# Patient Record
Sex: Female | Born: 1955 | Race: Black or African American | Hispanic: No | Marital: Married | Smoking: Never smoker
Health system: Southern US, Community
[De-identification: ages and names within clinical notes are randomized; demographics above are authoritative.]

## PROBLEM LIST (undated history)

## (undated) DIAGNOSIS — I10 Essential (primary) hypertension: Secondary | ICD-10-CM

## (undated) DIAGNOSIS — E079 Disorder of thyroid, unspecified: Secondary | ICD-10-CM

## (undated) DIAGNOSIS — E039 Hypothyroidism, unspecified: Secondary | ICD-10-CM

## (undated) DIAGNOSIS — N289 Disorder of kidney and ureter, unspecified: Secondary | ICD-10-CM

## (undated) DIAGNOSIS — R011 Cardiac murmur, unspecified: Secondary | ICD-10-CM

## (undated) DIAGNOSIS — I341 Nonrheumatic mitral (valve) prolapse: Secondary | ICD-10-CM

## (undated) HISTORY — PX: ABLATION: SHX5711

## (undated) HISTORY — PX: BUNIONECTOMY: SHX129

## (undated) HISTORY — DX: Disorder of kidney and ureter, unspecified: N28.9

## (undated) HISTORY — DX: Cardiac murmur, unspecified: R01.1

## (undated) HISTORY — PX: MANDIBLE SURGERY: SHX707

## (undated) HISTORY — DX: Disorder of thyroid, unspecified: E07.9

## (undated) HISTORY — DX: Essential (primary) hypertension: I10

## (undated) HISTORY — DX: Nonrheumatic mitral (valve) prolapse: I34.1

---
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
## 2005-09-07 ENCOUNTER — Ambulatory Visit: Payer: Self-pay | Admitting: Cardiology

## 2006-12-11 ENCOUNTER — Encounter: Admission: RE | Admit: 2006-12-11 | Discharge: 2006-12-11 | Payer: Self-pay | Admitting: Obstetrics and Gynecology

## 2006-12-25 ENCOUNTER — Encounter: Admission: RE | Admit: 2006-12-25 | Discharge: 2006-12-25 | Payer: Self-pay | Admitting: Obstetrics and Gynecology

## 2007-04-30 ENCOUNTER — Ambulatory Visit: Payer: Self-pay | Admitting: Gastroenterology

## 2007-06-28 ENCOUNTER — Ambulatory Visit: Payer: Self-pay | Admitting: Gastroenterology

## 2007-07-03 ENCOUNTER — Encounter: Payer: Self-pay | Admitting: Gastroenterology

## 2007-07-03 ENCOUNTER — Ambulatory Visit: Payer: Self-pay | Admitting: Gastroenterology

## 2007-07-03 DIAGNOSIS — E039 Hypothyroidism, unspecified: Secondary | ICD-10-CM

## 2007-07-03 DIAGNOSIS — D509 Iron deficiency anemia, unspecified: Secondary | ICD-10-CM

## 2007-07-03 HISTORY — PX: COLONOSCOPY: SHX174

## 2007-07-05 ENCOUNTER — Encounter: Admission: RE | Admit: 2007-07-05 | Discharge: 2007-07-05 | Payer: Self-pay | Admitting: Obstetrics and Gynecology

## 2007-08-20 ENCOUNTER — Ambulatory Visit: Payer: Self-pay | Admitting: Gastroenterology

## 2007-08-20 LAB — CONVERTED CEMR LAB
OCCULT 2: NEGATIVE
OCCULT 3: NEGATIVE
OCCULT 4: NEGATIVE

## 2007-08-29 ENCOUNTER — Ambulatory Visit (HOSPITAL_COMMUNITY): Admission: RE | Admit: 2007-08-29 | Discharge: 2007-08-29 | Payer: Self-pay | Admitting: Obstetrics and Gynecology

## 2007-08-29 ENCOUNTER — Encounter (INDEPENDENT_AMBULATORY_CARE_PROVIDER_SITE_OTHER): Payer: Self-pay | Admitting: Obstetrics and Gynecology

## 2007-08-29 DIAGNOSIS — I059 Rheumatic mitral valve disease, unspecified: Secondary | ICD-10-CM | POA: Insufficient documentation

## 2007-08-29 DIAGNOSIS — I4949 Other premature depolarization: Secondary | ICD-10-CM

## 2007-08-29 DIAGNOSIS — J029 Acute pharyngitis, unspecified: Secondary | ICD-10-CM | POA: Insufficient documentation

## 2007-11-15 ENCOUNTER — Ambulatory Visit: Payer: Self-pay

## 2007-11-21 ENCOUNTER — Ambulatory Visit: Payer: Self-pay | Admitting: Cardiology

## 2008-03-03 ENCOUNTER — Ambulatory Visit: Payer: Self-pay

## 2008-03-03 ENCOUNTER — Encounter: Payer: Self-pay | Admitting: Cardiology

## 2008-03-12 ENCOUNTER — Encounter: Admission: RE | Admit: 2008-03-12 | Discharge: 2008-03-12 | Payer: Self-pay | Admitting: Obstetrics and Gynecology

## 2008-03-12 ENCOUNTER — Ambulatory Visit: Payer: Self-pay | Admitting: Cardiology

## 2008-03-12 LAB — CONVERTED CEMR LAB
BUN: 13 mg/dL (ref 6–23)
CO2: 27 meq/L (ref 19–32)
Creatinine, Ser: 1 mg/dL (ref 0.4–1.2)

## 2009-01-11 ENCOUNTER — Encounter (INDEPENDENT_AMBULATORY_CARE_PROVIDER_SITE_OTHER): Payer: Self-pay | Admitting: *Deleted

## 2009-12-07 ENCOUNTER — Encounter: Admission: RE | Admit: 2009-12-07 | Discharge: 2009-12-07 | Payer: Self-pay | Admitting: Obstetrics and Gynecology

## 2010-07-17 ENCOUNTER — Encounter: Payer: Self-pay | Admitting: Obstetrics and Gynecology

## 2010-07-18 ENCOUNTER — Encounter: Payer: Self-pay | Admitting: Obstetrics and Gynecology

## 2010-11-08 NOTE — Op Note (Signed)
Courtney Elliott, Courtney Elliott              ACCOUNT NO.:  [id number]   MEDICAL RECORD NO.:  [id number]          PATIENT TYPE:  AMB   LOCATION:  SDC                           FACILITY:  [facility]   PHYSICIAN:  Hal Morales, M.D.DATE OF BIRTH:  03-13-1956   DATE OF PROCEDURE:  08/29/2007  DATE OF DISCHARGE:                               OPERATIVE REPORT   PREOPERATIVE DIAGNOSES:  1. Intermenstrual bleeding.  2. Endometrial polyp.  3. Menorrhagia.   POSTOPERATIVE DIAGNOSES:  1. Intermenstrual bleeding.  2. Endometrial polyp.  3. Menorrhagia.   OPERATION:  Hysteroscopy, polyp resection, uterine curettage,  endometrial ablation with NovaSure.   SURGEON:  Dr. Dierdre Forth.   ANESTHESIA:  General.   ESTIMATED BLOOD LOSS:  Less than 10 mL.   COMPLICATIONS:  None.   FINDINGS:  The uterus sounded to 8 cm.  The cervical length was 2 cm.  The endometrial width was 4.3 cm.  There was a 1 cm polypoid lesion  emanating from the left lateral endometrial cavity.  The remainder of  the endometrium appeared atrophic.   DESCRIPTION OF PROCEDURE:  The patient was taken to the operating room  after appropriate identification and placed on the operating table.  After the attainment of adequate general anesthesia, she was placed in  the lithotomy position. The perineum and vagina were prepped with  multiple layers of Betadine and draped as a sterile field.  A red  Robinson catheter was used to empty the bladder.  A Graves speculum was  placed in the vagina and a paracervical block achieved with a total of  10 mL of 2% Xylocaine in the 5 and 7 o'clock positions.  The cervix was  grasped with a single-tooth tenaculum. The cervix was measured and then  the uterus sounded.  The cervix was dilated to accommodate the small  hysteroscope and that was used to observe and document the above-noted  findings.  A scissor was placed through the operating channel of the  hysteroscope and the base of the polyp,  cut, then a grasping forceps  used to remove the polyps from the endometrial cavity. The remainder of  the endometrial cavity was then curetted.  The cervix was further  dilated to accommodate the NovaSure endometrial ablation apparatus.  It  was placed into the endometrial cavity and the array opened.  It was  then seated and the test for endometrial integrity undertaken.  Initial  failure of that test and bubbling of gas from the posterior cervix  indicated that the cervical collar was not adequately occluding the  cervix.  The cervix was then wrapped with Vaseline gauze and the test  for integrity of the endometrial cavity subsequently passed.  The  endometrial ablation was then begun and continued for a total of 60  seconds.  The apparatus was allowed to cool and the array retracted into  the NovaSure apparatus and then the entire apparatus removed from the  endometrial cavity.  The tenaculum was removed from the cervix and all  instruments removed from the vagina.  The patient was awakened from  general anesthesia and taken  to the recovery room in satisfactory  condition having tolerated the procedure well with sponge and instrument  counts correct.  She received Toradol 30 mg IV and 30 mg IM  intraoperatively.   DISCHARGE INSTRUCTIONS:  Printed instructions from the [facility]  for [facility].   DISCHARGE MEDICATIONS:  1. Ibuprofen 600 mg p.o. q.6 h p.r.n. pain.  2. Doxycycline 100 mg p.o. b.i.d. for 5 days.  3. Synthroid 88 mcg daily.   FOLLOW-UP:  The patient will follow up in 2 weeks with Dr. Pennie Rushing.      Hal Morales, M.D.  Electronically Signed     VPH/MEDQ  D:  08/29/2007  T:  08/29/2007  Job:  [id number]

## 2010-11-08 NOTE — Assessment & Plan Note (Signed)
[facility]                            CARDIOLOGY OFFICE NOTE   CANDEE, [name]                       MRN:          [id number]  DATE:11/21/2007                            DOB:          01-18-1956    [name] is a 55 year old female who presents for evaluation of  palpitations, presyncope, and elevated blood pressure.  The patient  apparently has a long history of mitral valve prolapse.  She also has a  long history of palpitations.  She was seen in this office on September 07, 2005 by Nicolasa Ducking, ANP.  Note, she did have a Holter monitor  secondary to a history of syncope that showed PVCs and occasional  couplets.  It was felt that her syncope was most likely vasovagal in  etiology, and a tilt table was recommended, but she did not followup for  this.  An echocardiogram was also recommended, but she did not followup  for this either.  The patient, again, has a long history of syncope.  These typically occur with standing for a prolonged period of time such  as singing in the choir or in the shower.  She feels somewhat queasy  beforehand, as well as mildly nauseated, and this sounds to be vagal in  etiology.  She can avoid these episodes, sometimes by sitting down and  putting her head between her legs.  She has not had one these episodes  in 2-3 years.  She does occasionally feel her heart skip.  She does  not have dyspnea on exertion, orthopnea, PND, pedal edema, or exertional  chest pain.  She is a Financial controller.  Recently she was traveling to  [place] and felt bad.  She felt fatigued, presyncopal, and had  palpitations.  There was no chest pain, nausea, vomiting, or shortness  breath.  She was admitted to a facility in [place], and was told she had  an irregular heartbeat, but she left and preferred to be seen here.  She has felt much better since then.   MEDICATIONS AT PRESENT INCLUDE:  1. Multivitamin.  2. Synthroid 0.88 mg daily.  3. Metoprolol ER 25 mg p.o. daily.  4. Calcium.  5. Fish oil.   ALLERGIES:  She has no known drug allergies.   SOCIAL HISTORY:  She does not smoke.  She occasionally consumes alcohol.   FAMILY HISTORY:  Significant for her brother who has a defibrillator for  unclear reasons.  Her mother died of congestive heart failure.  She also  has a brother who had a myocardial infarction.   PAST MEDICAL HISTORY SIGNIFICANT FOR:  1. Mild hyperlipidemia.  2. There is no diabetes mellitus or hypertension by report, but she      does state that her blood pressure has begun to be elevated over      the past 6 months.  3. She has a history of hypothyroidism.  4. 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  5. She has a history of mitral valve prolapse by her report.   REVIEW OF SYSTEMS:  There is  no headaches or fevers, chills.  There is  no productive cough or hemoptysis.  There is no dysphagia, odynophagia,  melena, or hematochezia.  There is no dysuria or hematuria.  Denies  seizure activity.  There is no orthopnea, PND, or pedal edema.  The  remaining systems are negative.   PHYSICAL EXAMINATION:  Today shows a blood pressure of 146/95 and a  pulse of 72.  She weighs 111 pounds.  She is well-developed and very  thin.  She is no acute distress at present.  HEENT:  Normal.  NECK:  Supple.  CHEST:  Clear.  CARDIOVASCULAR EXAM:  Regular rate and rhythm.  There is a 2/6 systolic  murmur at the apex that radiates to the left axilla.  ABDOMINAL EXAM:  Shows no tenderness.  EXTREMITIES:  Show no edema.   ELECTROCARDIOGRAM:  From Nov 16, 2007 shows a normal sinus rhythm with  occasional PVCs.  There are no significant ST changes.   DIAGNOSES:  1. Palpitations.  These apparently are premature ventricular      contractions.  Toprol was added recently, we will see if this      improves her symptoms.  2. History of mitral valve prolapse.  We will plan to repeat her       echocardiogram both to quantify her left ventriculogram function,      and also to reassess her mitral regurgitation.  3. History of syncope.  This sounds to be vagal, and she has not had      an episode in 2-3 years.  We will follow as expected.  4. History of elevated blood pressure.  I have asked her to track this      at home.  Hopefully the Toprol will help with this.  She will keep      records, and we will see her back in approximately 8 weeks and      adjust her regimen as indicated.  Note, she only has one kidney, I      will schedule her to have renal Dopplers to exclude significant      renal artery stenosis.  I will also check a BMET and a TSH.  5. Hypothyroidism.  Management per her primary care physician.  6. 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     Madolyn Frieze Jens Som, MD, [facility]  Electronically Signed    BSC/MedQ  DD: 11/21/2007  DT: 11/21/2007  Job #: [id number]

## 2010-11-08 NOTE — H&P (Signed)
NAMESHARRAN, CARATACHEA              ACCOUNT NO.:  [id number]   MEDICAL RECORD NO.:  [id number]          PATIENT TYPE:  AMB   LOCATION:  SDC                           FACILITY:  [facility]   PHYSICIAN:  Hal Morales, M.D.DATE OF BIRTH:  1955-07-23   DATE OF ADMISSION:  08/29/2007  DATE OF DISCHARGE:                              HISTORY & PHYSICAL   HISTORY OF PRESENT ILLNESS:  The patient is a 55 year old black married  female para 3-0-0-3 who presents for removal of endometrial polyp and  endometrial ablation for intermenstrual bleeding and menorrhagia.  The  patient has a history of intermittent spotting which goes back to May of  2008.  At that time, the patient was noted to have an endocervical  polyp, and that was removed with normal pathology.  Then the patient  noted that she was having a clear bloody discharge whenever straining,  especially with exercise.  She continued to have her usual menstrual  periods occurring monthly, flowing for 7 days, with the first of those  days been quite heavy.  She does have hypothyroidism but had a normal  fibroid evaluation in October of 2008.  She does have some pelvic  pressure with the spotting.  She was evaluated with a pelvic ultrasound  which showed a uterus that was normal in size and normal right and left  ovaries.  A sonohysterogram revealed two hyperechoic masses on the  anterior uterine wall measuring 1 cm and 0.7 cm in their greatest  dimensions.  The endometrial width was 3.1 cm.  At that time, the  patient expressed her desire to proceed with removal of the afore noted  apparent endometrial polyps.   PAST OBSTETRICAL HISTORY:  The patient had three spontaneous vaginal  deliveries, two at term and one preterm, all of which the female infants  did well.  She uses vasectomy as her contraceptive.   PAST GYNECOLOGIC HISTORY:  Negative, except as mentioned above.  Her  last Pap smear was May of 2008 and was within normal limits.   PAST MEDICAL HISTORY:  1. The patient does have hypothyroidism and was euthyroid on last      evaluation.  She wishes to have her thyroid function checked today.  2. Mitral valve prolapse.   CURRENT MEDICATIONS:  Synthroid 100 mcg daily.   ALLERGIES:  NONE KNOWN.   SOCIAL HISTORY:  No tobacco, alcohol, or illicit drug use.  The patient  is married.   FAMILY HISTORY:  Positive for hypertension, heart disease, and diabetes.   REVIEW OF SYSTEMS:  Negative, except as mentioned above.  She  specifically denies any shortness of breath or chest pain.   PHYSICAL EXAMINATION:  GENERAL:  The patient is a thin black female in  no acute distress.  VITAL SIGNS:  Temperature is 97.9, pulse 58, respirations 24, blood  pressure 120/80, weight 108 pounds.  Height is 5 feet 5 inches.  HEENT:  Within normal limits.  LUNGS:  Clear.  HEART:  Regular rate and rhythm.  ABDOMEN:  Soft.  There is a supraumbilical nodule measuring 0.75 cm that  is just  above the umbilicus exactly in the midline, nontender and  mobile.  EXTREMITIES:  No clubbing, cyanosis, or edema.  PELVIC:  EGBUS within normal limits.  The vagina is rugose with blood in  the vault.  The cervix is without gross lesions.  The uterus is upper  limits of normal size, posterior, mobile and nontender.  Adnexa with no  masses.   IMPRESSION:  1. Intermittent spotting.  2. Menorrhagia.  3. Hypothyroidism, well compensated with Synthroid.  4. Supraumbilical nodule.   DISPOSITION:  Discussions have been held with the patient concerning  management of her gynecologic bleeding issues.  Her sonohysterogram is  consistent with a polyp, and she wishes to undergo hysteroscopy with  removal of that polyp.  She likewise wishes to undergo endometrial  ablation in an effort to minimize her menses through the time she would  expect menopause.  The risks of anesthesia, bleeding, infection, damage  to adjacent organs, and uterine perforation are all  explained.  There  was also a discussion of the approximate 85% success rate with  improvement of her periods in addition to the 15% failure rate with no  improvement in her periods.  The patient seemed to understand and wishes  to proceed.  1. She has had an evaluation by Dr. Leonie Man concerning the      supraumbilical mass and wishes to have that resected at a totally      different time in spite of having it offered that it could be done      simultaneously.  2. A TSH is done today, and that result will be available at the time      of her surgery.      Hal Morales, M.D.  Electronically Signed     VPH/MEDQ  D:  08/26/2007  T:  08/26/2007  Job:  [id number]

## 2010-11-08 NOTE — Assessment & Plan Note (Signed)
[facility]                            CARDIOLOGY OFFICE NOTE   KAELEE, PFEFFER                       MRN:          [id number]  DATE:03/12/2008                            DOB:          19-Apr-1956    Mrs. Courtney Elliott is a pleasant 55 year old female that I recently saw on Nov 21, 2007, secondary to palpitations, presyncope, and elevated blood  pressure.  We did scheduled her to have an echocardiogram, which was  performed on March 03, 2008.  Her LV function was low normal and in  the range of 50-55%.  There appeared to be incomplete coaptation of a  small portion of the mitral valve.  There was mild-to-moderate mitral  regurgitation and the jet was eccentric and directed posteriorly.  Given  her recent onset of hypertension, we did schedule her to have a renal  ultrasound which was also performed on March 03, 2008.  She does have  a history of left nephrectomy.  However, her aorta was normal as was the  right kidney size.  Her right renal artery was also normal.  Note, she  did have Toprol added to her medical regimen as well at 25 mg p.o.  daily.  Since then, she denies any chest pain, dyspnea, or syncope.  There is no pedal edema.  Her palpitations have also improved.   MEDICATIONS:  1. Vitamin daily.  2. Synthroid 0.088 mg p.o. daily.  3. Toprol 25 mg p.o. daily.  4. Calcium.  5. Fish oil.   PHYSICAL EXAMINATION:  VITAL SIGNS:  A blood pressure of 132/80 and  pulse was 80.  She weighs 110 pounds.  HEENT:  Normal.  NECK:  Supple.  CHEST:  Clear.  CARDIOVASCULAR:  Regular rhythm.  ABDOMEN:  No tenderness.  EXTREMITIES:  No edema.   DIAGNOSES:  1. Palpitations - this is felt secondary to premature ventricular      contractions.  We will continue with her Toprol and these have      improved.  2. History of mitral valve prolapse - recent echocardiogram showed      continued mitral valve prolapse with mild-to-moderate mitral  regurgitation.  She will need followup echocardiograms in the      future.  3. History of syncope - this sounds to be vagal and she has had no      episodes in 2-3 years.  We will not pursue this further at this      point.  4. Hypertension - her blood pressure is much improved today.  She will      continue on a present dose of Toprol.  I will check a BMET and TSH.  5. Hypothyroidism.  6. 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   We will see her back in 1 year.     Courtney Elliott Courtney Som, MD, [facility]  Electronically Signed    BSC/MedQ  DD: 03/12/2008  DT: 03/12/2008  Job #: [id number]

## 2010-11-08 NOTE — Assessment & Plan Note (Signed)
[facility]                         GASTROENTEROLOGY OFFICE NOTE   EMILYROSE, DARRAH                       MRN:          [id number]  DATE:04/30/2007                            DOB:          10-11-1955    REFERRING PHYSICIAN:  Belva Agee, P.A. at Ernestina Penna, M.D.'s  office.   REASON FOR CONSULTATION:  Belva Agee, P.A. asked me to evaluate this  patient in consultation regarding iron deficiency anemia, colorectal  cancer screening.   HISTORY OF PRESENT ILLNESS:  The patient is a very pleasant 55 year old  woman who has always had anemia as far back as can remember.  She says  that it has always been mild.  She recently had lab tests confirming  that she is still anemic with a hemoglobin of 10.8, a low MCV of 79.  Complete metabolic profile was normal.  Thyroid testing showed that she  was slightly hypothyroid.  She has never had colorectal cancer screening  and has had no overt GI bleeding, no trouble with constipation or  diarrhea.  She is still menstruating monthly.   REVIEW OF SYSTEMS:  Notable for essentially stable weight, otherwise is  normal as noted on nursing intake sheet.   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   CURRENT MEDICATIONS:  1. Synthroid.  2. Vitamin.  She recently started the vitamins containing iron.   ALLERGIES:  No known drug allergies.   SOCIAL HISTORY:  Married.  She lives with her husband and three  children.  She works as a Financial controller for [employer].  She drinks  alcohol intermittently and is a nonsmoker.   FAMILY HISTORY:  No colon cancer or colon polyps in the family.   PHYSICAL EXAMINATION:  VITAL SIGNS:  5 feet 6 inches, 111 pounds, blood  pressure 120/70, pulse 80.  CONSTITUTIONAL:  Generally well appearing.  NEUROLOGY:  Alert and oriented x3.  HEENT:  Extraocular movements intact.  Oropharynx moist with no lesions.  NECK:  Supple, no lymphadenopathy.  CARDIOVASCULAR:  Regular rate and rhythm.  LUNGS:  Clear to auscultation bilaterally.  ABDOMEN:  Soft, nontender, and nondistended.  Normal bowel sounds.  EXTREMITIES:  No lower extremity edema.  No rashes or lesions on visible  extremities.   ASSESSMENT:  A 55 year old woman with iron deficiency anemia, routine  risk for colorectal cancer.   We will arrange for her to have a colonoscopy performed at her soonest  convenience.  I suspect her anemia is probably related to her still  monthly menstrual cycle.  That being said she was due anyway for a  colorectal cancer screening with colonoscopy as she just turned 51 and  so we will arrange for that to be done at her soonest convenience.  She  wants to wait until January due to hectic work schedule and I think that  is reasonable.  I see no reason for any further blood tests or imaging  studies prior to that.     Rachael Fee, MD  Electronically Signed    DPJ/MedQ  DD: 04/30/2007  DT: 05/01/2007  Job #: [id number]   cc:   Ernestina Penna, M.D.

## 2011-01-05 ENCOUNTER — Other Ambulatory Visit: Payer: Self-pay | Admitting: Obstetrics and Gynecology

## 2011-01-05 DIAGNOSIS — M79604 Pain in right leg: Secondary | ICD-10-CM

## 2011-01-06 ENCOUNTER — Other Ambulatory Visit: Payer: Self-pay

## 2011-01-09 ENCOUNTER — Ambulatory Visit
Admission: RE | Admit: 2011-01-09 | Discharge: 2011-01-09 | Disposition: A | Discharge status: Home / Self Care | Payer: BC Managed Care – PPO | Source: Ambulatory Visit | Attending: Obstetrics and Gynecology | Admitting: Obstetrics and Gynecology

## 2011-01-09 DIAGNOSIS — M79604 Pain in right leg: Secondary | ICD-10-CM

## 2011-03-06 ENCOUNTER — Other Ambulatory Visit: Payer: Self-pay | Admitting: Obstetrics and Gynecology

## 2011-03-06 DIAGNOSIS — Z1231 Encounter for screening mammogram for malignant neoplasm of breast: Secondary | ICD-10-CM

## 2011-03-08 ENCOUNTER — Ambulatory Visit
Admission: RE | Admit: 2011-03-08 | Discharge: 2011-03-08 | Disposition: A | Discharge status: Home / Self Care | Payer: BC Managed Care – PPO | Source: Ambulatory Visit | Attending: Obstetrics and Gynecology | Admitting: Obstetrics and Gynecology

## 2011-03-08 DIAGNOSIS — Z1231 Encounter for screening mammogram for malignant neoplasm of breast: Secondary | ICD-10-CM

## 2011-03-16 ENCOUNTER — Other Ambulatory Visit: Payer: Self-pay | Admitting: Surgery

## 2011-03-20 LAB — CBC
HCT: 38.1
MCV: 81.7
Platelets: 161

## 2012-04-10 ENCOUNTER — Other Ambulatory Visit: Payer: Self-pay | Admitting: Obstetrics and Gynecology

## 2012-04-10 DIAGNOSIS — Z1231 Encounter for screening mammogram for malignant neoplasm of breast: Secondary | ICD-10-CM

## 2012-05-14 ENCOUNTER — Ambulatory Visit: Payer: BC Managed Care – PPO

## 2012-07-10 ENCOUNTER — Ambulatory Visit
Admission: RE | Admit: 2012-07-10 | Discharge: 2012-07-10 | Disposition: A | Discharge status: Home / Self Care | Payer: BC Managed Care – PPO | Source: Ambulatory Visit | Attending: Obstetrics and Gynecology | Admitting: Obstetrics and Gynecology

## 2012-07-10 DIAGNOSIS — Z1231 Encounter for screening mammogram for malignant neoplasm of breast: Secondary | ICD-10-CM

## 2012-07-29 ENCOUNTER — Encounter: Payer: Self-pay | Admitting: Obstetrics and Gynecology

## 2012-07-29 ENCOUNTER — Ambulatory Visit: Payer: BC Managed Care – PPO | Admitting: Obstetrics and Gynecology

## 2012-07-29 VITALS — BP 120/80 | HR 68 | Ht 65.5 in | Wt 111.0 lb

## 2012-07-29 DIAGNOSIS — Z124 Encounter for screening for malignant neoplasm of cervix: Secondary | ICD-10-CM

## 2012-07-29 NOTE — Progress Notes (Signed)
Subjective:  Last Pap: 12/22/09, due today per last note WNL: Yes Regular Periods:no Contraception: husband vasectomy   Monthly Breast exam:yes Tetanus<22yrs:no Nl.Bladder Function:yes Daily BMs:yes Healthy Diet:yes Calcium:yes Mammogram:yes Date of Mammogram: 06/2012 Exercise:yes Have often Exercise: 5 days per week  Seatbelt: yes Abuse at home: no Stressful work:yes Sigmoid-colonoscopy: 5 years ago  Bone Density: Yes 12/22/09 PCP: [facility]  Change in PMH: no changes Change in [facility]: maternal aunt passed away with Alzheimer's disease  Courtney Elliott is a 57 y.o. female G3P3 who presents for annual exam.  The patient has no complaints today.   The following portions of the patient's history were reviewed and updated as appropriate: allergies, current medications, past family history, past medical history, past social history, past surgical history and problem list.  Review of Systems Pertinent items are noted in HPI. Gastrointestinal:No change in bowel habits, no change in abdominal pain of known left inguinal hernia. no rectal bleeding Genitourinary:negative for dysuria, frequency, hematuria, nocturia and urinary incontinence    Objective:     BP 120/80  Pulse 68  Ht 5' 5.5" (1.664 m)  Wt 111 lb (50.349 kg)  BMI 18.19 kg/m2  Weight:  Wt Readings from Last 1 Encounters:  07/29/12 111 lb (50.349 kg)     BMI: Body mass index is 18.19 kg/(m^2). General Appearance: Alert, appropriate appearance for age. No acute distress HEENT: Grossly normal Neck / Thyroid: Supple, no masses, nodes or enlargement Lungs: clear to auscultation bilaterally Back: No CVA tenderness Breast Exam: No masses or nodes.No dimpling, nipple retraction or discharge. Cardiovascular: Regular rate and rhythm. S1, S2, no murmur Gastrointestinal: Soft, non-tender, no masses or organomegaly Pelvic Exam: Vulva and vagina appear normal. There is exocervical contact bleeding Bimanual  exam reveals normal uterus and adnexa. Rectovaginal: normal rectal, no masses Lymphatic Exam: Non-palpable nodes in neck, clavicular, axillary, or inguinal regions Skin: no rash or abnormalities Neurologic: Normal gait and speech, no tremor  Psychiatric: Alert and oriented, appropriate affect.    Urinalysis:Not done    Assessment:    Normal gyn exam  Hx inguinal hernia with pt declining surgery at this time   Plan:   pap smear return annually or prn    Dierdre Forth MD

## 2012-07-30 LAB — PAP IG AND HPV HIGH-RISK: HPV DNA High Risk: NOT DETECTED

## 2012-09-19 ENCOUNTER — Ambulatory Visit: Payer: Self-pay | Admitting: Nurse Practitioner

## 2012-10-04 ENCOUNTER — Encounter: Payer: Self-pay | Admitting: Family Medicine

## 2012-10-04 ENCOUNTER — Ambulatory Visit (INDEPENDENT_AMBULATORY_CARE_PROVIDER_SITE_OTHER): Payer: BC Managed Care – PPO | Admitting: Family Medicine

## 2012-10-04 VITALS — BP 130/80 | HR 86 | Temp 97.0°F | Ht 66.0 in | Wt 113.6 lb

## 2012-10-04 DIAGNOSIS — Q602 Renal agenesis, unspecified: Secondary | ICD-10-CM

## 2012-10-04 DIAGNOSIS — D509 Iron deficiency anemia, unspecified: Secondary | ICD-10-CM

## 2012-10-04 DIAGNOSIS — Z905 Acquired absence of kidney: Secondary | ICD-10-CM

## 2012-10-04 DIAGNOSIS — E039 Hypothyroidism, unspecified: Secondary | ICD-10-CM

## 2012-10-04 DIAGNOSIS — I059 Rheumatic mitral valve disease, unspecified: Secondary | ICD-10-CM

## 2012-10-04 DIAGNOSIS — D649 Anemia, unspecified: Secondary | ICD-10-CM

## 2012-10-04 DIAGNOSIS — I10 Essential (primary) hypertension: Secondary | ICD-10-CM | POA: Insufficient documentation

## 2012-10-04 DIAGNOSIS — I4949 Other premature depolarization: Secondary | ICD-10-CM

## 2012-10-04 LAB — COMPLETE METABOLIC PANEL WITH GFR
ALT: 19 U/L (ref 0–35)
AST: 26 U/L (ref 0–37)
Albumin: 4.5 g/dL (ref 3.5–5.2)
Alkaline Phosphatase: 99 U/L (ref 39–117)
BUN: 19 mg/dL (ref 6–23)
CO2: 26 mEq/L (ref 19–32)
Calcium: 9.8 mg/dL (ref 8.4–10.5)
Chloride: 103 mEq/L (ref 96–112)
Creat: 0.96 mg/dL (ref 0.50–1.10)
GFR, Est African American: 77 mL/min
GFR, Est Non African American: 67 mL/min
Glucose, Bld: 86 mg/dL (ref 70–99)
Potassium: 4.8 mEq/L (ref 3.5–5.3)
Sodium: 140 mEq/L (ref 135–145)
Total Bilirubin: 0.5 mg/dL (ref 0.3–1.2)
Total Protein: 7.7 g/dL (ref 6.0–8.3)

## 2012-10-04 LAB — POCT CBC
Granulocyte percent: 33.8 %G — AB (ref 37–80)
HCT, POC: 38.4 % (ref 37.7–47.9)
Hemoglobin: 12.2 g/dL (ref 12.2–16.2)
Lymph, poc: 2.3 (ref 0.6–3.4)
MCH, POC: 24.8 pg — AB (ref 27–31.2)
MCHC: 31.6 g/dL — AB (ref 31.8–35.4)
MCV: 78.2 fL — AB (ref 80–97)
MPV: 8.7 fL (ref 0–99.8)
POC Granulocyte: 1.3 — AB (ref 2–6.9)
POC LYMPH PERCENT: 59.5 %L — AB (ref 10–50)
Platelet Count, POC: 146 10*3/uL (ref 142–424)
RBC: 4.9 M/uL (ref 4.04–5.48)
RDW, POC: 13.8 %
WBC: 3.9 10*3/uL — AB (ref 4.6–10.2)

## 2012-10-04 LAB — TSH: TSH: 4.087 u[IU]/mL (ref 0.350–4.500)

## 2012-10-04 MED ORDER — METOPROLOL TARTRATE 25 MG PO TABS: 25.0000 mg | ORAL_TABLET | Freq: Two times a day (BID) | ORAL | Status: DC

## 2012-10-04 MED ORDER — LEVOTHYROXINE SODIUM 75 MCG PO TABS: 75.0000 ug | ORAL_TABLET | Freq: Every day | ORAL | Status: DC

## 2012-10-04 NOTE — Progress Notes (Signed)
Patient ID: Courtney Elliott, female   DOB: 27-Jun-1955, 57 y.o.   MRN: [id number] SUBJECTIVE:   HPI: Patient is here for follow up of hypertension: deniesHeadache;deniesChest Pain;deniesweakness;deniesShortness of Breath or Orthopnea;deniesVisual changes;deniespalpitations;deniescough;deniespedal edema;deniessymptoms of TIA or stroke; admits toCompliance with medications. deniesProblems with medications. PVCs have been stable with the beta blocker. Anemia stable. Single Kidney:stable.  PMH/PSH: reviewed/updated in Epic  SH/FH: reviewed/updated in Epic  Allergies: reviewed/updated in Epic  Medications: reviewed/updated in Epic  Immunizations: reviewed/updated in Epic  ROS: As above in the HPI. All other systems are stable or negative.  OBJECTIVE:    APPEARANCE:  African American female Slim built. Patient in no acute distress.The patient appeared well nourished and normally developed. Acyanotic.  Waist:  VITAL SIGNS:BP 130/80  Pulse 86  Temp(Src) 97 F (36.1 C) (Oral)  Ht 5\' 6"  (1.676 m)  Wt 113 lb 9.6 oz (51.529 kg)  BMI 18.34 kg/m2   SKIN: warm and  Dry without overt rashes, tattoos and scars  HEAD and Neck: without JVD, Normal No scleral icterus  CHEST & LUNGS: Clear  CVS: Reveals the PMI to be normally located. Regular rhythm, First and Second Heart sounds are normal, and absence of murmurs, rubs or gallops.  ABDOMEN:  Benign,, no organomegaly, no masses, no Abdominal Aortic enlargement. No Guarding , no rebound. No Bruits.  RECTAL:n/a  GU:n/a  EXTREMETIES: nonedematous. Both Femoral and Pedal pulses are normal.  MUSCULOSKELETAL:  Spine: normal Joints: intact  NEUROLOGIC: oriented to time,place and person; nonfocal. Strength is normal Sensory is normal Reflexes are normal Cranial Nerves are normal.   ASSESSMENT:  HYPOTHYROIDISM - Plan: TSH  HTN (hypertension)  Single kidney - Plan: COMPLETE METABOLIC PANEL WITH GFR  Anemia, unspecified -  Plan: POCT CBC  MITRAL VALVE PROLAPSE  PREMATURE VENTRICULAR CONTRACTIONS  ANEMIA, IRON DEFICIENCY, CHRONIC   PLAN:  Diet and Exercise discussed with patient. For nutrition information, I recommend books: Eat to Live by Dr Monico Hoar. Prevent and Reverse Heart Disease by Dr Suzzette Righter.  Exercise recommendations are:  If unable to walk, then the patient can exercise in a chair 3 times a day. By flapping arms like a bird gently and raising legs outwards to the front.  If ambulatory, the patient can go for walks for 30 minutes 3 times a week. Then increase the intensity and duration as tolerated. Goal is to try to attain exercise frequency to 5 times a week. Best to perform resistance exercises 2 days a week and cardio type exercises 3 days per week.  Orders Placed This Encounter  Procedures  . COMPLETE METABOLIC PANEL WITH GFR  . TSH  . POCT CBC   Results for orders placed in visit on 10/04/12 (from the past 24 hour(s))  POCT CBC     Status: Abnormal   Collection Time    10/04/12 10:37 AM      Result Value Range   WBC 3.9 (*) 4.6 - 10.2 K/uL   Lymph, poc 2.3  0.6 - 3.4   POC LYMPH PERCENT 59.5 (*) 10 - 50 %L   POC Granulocyte 1.3 (*) 2 - 6.9   Granulocyte percent 33.8 (*) 37 - 80 %G   RBC 4.9  4.04 - 5.48 M/uL   Hemoglobin 12.2  12.2 - 16.2 g/dL   HCT, POC 78.4  69.6 - 47.9 %   MCV 78.2 (*) 80 - 97 fL   MCH, POC 24.8 (*) 27 - 31.2 pg   MCHC 31.6 (*) 31.8 - 35.4 g/dL  RDW, POC 13.8     Platelet Count, POC 146  142 - 424 K/uL   MPV 8.7  0 - 99.8 fL   Meds ordered this encounter  Medications  . metoprolol tartrate (LOPRESSOR) 25 MG tablet    Sig: Take 1 tablet (25 mg total) by mouth 2 (two) times daily.    Dispense:  60 tablet    Refill:  5  . levothyroxine (SYNTHROID) 75 MCG tablet    Sig: Take 1 tablet (75 mcg total) by mouth daily.    Dispense:  30 tablet    Refill:  11  Discussed hypertension, and effect of single kidney. RTC 3 months  Delainie Chavana P.  [place] Charon, M.D.

## 2012-10-04 NOTE — Progress Notes (Signed)
Quick Note:  Call patient. Labs normal. No change in plan. ______ 

## 2012-10-04 NOTE — Patient Instructions (Addendum)
  For nutrition information, I recommend books: Eat to Live by Dr Monico Hoar. Prevent and Reverse Heart Disease by Dr Suzzette Righter.  Exercise recommendations are:  If unable to walk, then the patient can exercise in a chair 3 times a day. By flapping arms like a bird gently and raising legs outwards to the front.  If ambulatory, the patient can go for walks for 30 minutes 3 times a week. Then increase the intensity and duration as tolerated. Goal is to try to attain exercise frequency to 5 times a week. Best to perform resistance exercises 2 days a week and cardio type exercises 3 days per week.

## 2013-01-03 ENCOUNTER — Ambulatory Visit: Payer: BC Managed Care – PPO | Admitting: Family Medicine

## 2013-04-22 ENCOUNTER — Other Ambulatory Visit: Payer: Self-pay | Admitting: Family Medicine

## 2013-04-23 NOTE — Telephone Encounter (Signed)
LAST OV 4/14. NTBS

## 2013-04-25 NOTE — Telephone Encounter (Signed)
Patient needs to be seen. Patient has exceeded limit since last visit. Refill denied. Bring all medications at next office visit. 

## 2013-05-06 ENCOUNTER — Other Ambulatory Visit: Payer: Self-pay | Admitting: Family Medicine

## 2013-05-09 ENCOUNTER — Other Ambulatory Visit: Payer: Self-pay | Admitting: Family Medicine

## 2013-07-02 ENCOUNTER — Ambulatory Visit: Payer: BC Managed Care – PPO | Admitting: Family Medicine

## 2013-07-04 ENCOUNTER — Encounter: Payer: Self-pay | Admitting: Family Medicine

## 2013-07-04 ENCOUNTER — Ambulatory Visit (INDEPENDENT_AMBULATORY_CARE_PROVIDER_SITE_OTHER): Payer: BC Managed Care – PPO | Admitting: Family Medicine

## 2013-07-04 VITALS — BP 131/86 | HR 87 | Temp 97.9°F | Ht 66.0 in | Wt 110.0 lb

## 2013-07-04 DIAGNOSIS — I10 Essential (primary) hypertension: Secondary | ICD-10-CM

## 2013-07-04 DIAGNOSIS — Z23 Encounter for immunization: Secondary | ICD-10-CM

## 2013-07-04 MED ORDER — METOPROLOL TARTRATE 25 MG PO TABS: 25.0000 mg | ORAL_TABLET | Freq: Two times a day (BID) | ORAL | Status: DC

## 2013-07-04 MED ORDER — LEVOTHYROXINE SODIUM 75 MCG PO TABS: 75.0000 ug | ORAL_TABLET | Freq: Every day | ORAL | Status: DC

## 2013-07-04 NOTE — Patient Instructions (Addendum)
Influenza Virus Vaccine injection What is this medicine? INFLUENZA VIRUS VACCINE (in floo EN zuh VAHY ruhs vak SEEN) helps to reduce the risk of getting influenza also known as the flu. The vaccine only helps protect you against some strains of the flu. This medicine may be used for other purposes; ask your health care provider or pharmacist if you have questions. COMMON BRAND NAME(S): Afluria , Agriflu, Fluarix Quadrivalent, Fluarix, FLUCELVAX, Flulaval, Fluvirin, Fluzone High-Dose, Fluzone Intradermal, Fluzone What should I tell my health care provider before I take this medicine? They need to know if you have any of these conditions: -bleeding disorder like hemophilia -fever or infection -Guillain-Barre syndrome or other neurological problems -immune system problems -infection with the human immunodeficiency virus (HIV) or AIDS -low blood platelet counts -multiple sclerosis -an unusual or allergic reaction to influenza virus vaccine, latex, other medicines, foods, dyes, or preservatives. Different brands of vaccines contain different allergens. Some may contain latex or eggs. Talk to your doctor about your allergies to make sure that you get the right vaccine. -pregnant or trying to get pregnant -breast-feeding How should I use this medicine? This vaccine is for injection into a muscle or under the skin. It is given by a health care professional. A copy of Vaccine Information Statements will be given before each vaccination. Read this sheet carefully each time. The sheet may change frequently. Talk to your healthcare provider to see which vaccines are right for you. Some vaccines should not be used in all age groups. Overdosage: If you think you have taken too much of this medicine contact a poison control center or emergency room at once. NOTE: This medicine is only for you. Do not share this medicine with others. What if I miss a dose? This does not apply. What may interact with this  medicine? -chemotherapy or radiation therapy -medicines that lower your immune system like etanercept, anakinra, infliximab, and adalimumab -medicines that treat or prevent blood clots like warfarin -phenytoin -steroid medicines like prednisone or cortisone -theophylline -vaccines This list may not describe all possible interactions. Give your health care provider a list of all the medicines, herbs, non-prescription drugs, or dietary supplements you use. Also tell them if you smoke, drink alcohol, or use illegal drugs. Some items may interact with your medicine. What should I watch for while using this medicine? Report any side effects that do not go away within 3 days to your doctor or health care professional. Call your health care provider if any unusual symptoms occur within 6 weeks of receiving this vaccine. You may still catch the flu, but the illness is not usually as bad. You cannot get the flu from the vaccine. The vaccine will not protect against colds or other illnesses that may cause fever. The vaccine is needed every year. What side effects may I notice from receiving this medicine? Side effects that you should report to your doctor or health care professional as soon as possible: -allergic reactions like skin rash, itching or hives, swelling of the face, lips, or tongue Side effects that usually do not require medical attention (report to your doctor or health care professional if they continue or are bothersome): -fever -headache -muscle aches and pains -pain, tenderness, redness, or swelling at the injection site -tiredness This list may not describe all possible side effects. Call your doctor for medical advice about side effects. You may report side effects to FDA at 1-800-FDA-1088. Where should I keep my medicine? The vaccine will be given by a  health care professional in a clinic, pharmacy, doctor's office, or other health care setting. You will not be given vaccine doses  to store at home. NOTE: This sheet is a summary. It may not cover all possible information. If you have questions about this medicine, talk to your doctor, pharmacist, or health care provider.  2014, Elsevier/Gold Standard. (2011-12-21 13:08:28) Hypertension As your heart beats, it forces blood through your arteries. This force is your blood pressure. If the pressure is too high, it is called hypertension (HTN) or high blood pressure. HTN is dangerous because you may have it and not know it. High blood pressure may mean that your heart has to work harder to pump blood. Your arteries may be narrow or stiff. The extra work puts you at risk for heart disease, stroke, and other problems.  Blood pressure consists of two numbers, a higher number over a lower, 110/72, for example. It is stated as "110 over 72." The ideal is below 120 for the top number (systolic) and under 80 for the bottom (diastolic). Write down your blood pressure today. You should pay close attention to your blood pressure if you have certain conditions such as:  Heart failure.  Prior heart attack.  Diabetes  Chronic kidney disease.  Prior stroke.  Multiple risk factors for heart disease. To see if you have HTN, your blood pressure should be measured while you are seated with your arm held at the level of the heart. It should be measured at least twice. A one-time elevated blood pressure reading (especially in the Emergency Department) does not mean that you need treatment. There may be conditions in which the blood pressure is different between your right and left arms. It is important to see your caregiver soon for a recheck. Most people have essential hypertension which means that there is not a specific cause. This type of high blood pressure may be lowered by changing lifestyle factors such as:  Stress.  Smoking.  Lack of exercise.  Excessive weight.  Drug/tobacco/alcohol use.  Eating less salt. Most people do not  have symptoms from high blood pressure until it has caused damage to the body. Effective treatment can often prevent, delay or reduce that damage. TREATMENT  When a cause has been identified, treatment for high blood pressure is directed at the cause. There are a large number of medications to treat HTN. These fall into several categories, and your caregiver will help you select the medicines that are best for you. Medications may have side effects. You should review side effects with your caregiver. If your blood pressure stays high after you have made lifestyle changes or started on medicines,   Your medication(s) may need to be changed.  Other problems may need to be addressed.  Be certain you understand your prescriptions, and know how and when to take your medicine.  Be sure to follow up with your caregiver within the time frame advised (usually within two weeks) to have your blood pressure rechecked and to review your medications.  If you are taking more than one medicine to lower your blood pressure, make sure you know how and at what times they should be taken. Taking two medicines at the same time can result in blood pressure that is too low. SEEK IMMEDIATE MEDICAL CARE IF:  You develop a severe headache, blurred or changing vision, or confusion.  You have unusual weakness or numbness, or a faint feeling.  You have severe chest or abdominal pain, vomiting, or  breathing problems. MAKE SURE YOU:   Understand these instructions.  Will watch your condition.  Will get help right away if you are not doing well or get worse. Document Released: 06/12/2005 Document Revised: 09/04/2011 Document Reviewed: 01/31/2008 [facility] Patient Information 2014 [place].

## 2013-07-04 NOTE — Progress Notes (Signed)
   Subjective:    Patient ID: Mayumi Summerson, female    DOB: 07/26/1955, 58 y.o.   MRN: [id number]  HPI  This 58 y.o. female presents for evaluation of neding refill on HTN medicine. She is not due for labs.  Review of Systems No chest pain, SOB, HA, dizziness, vision change, N/V, diarrhea, constipation, dysuria, urinary urgency or frequency, myalgias, arthralgias or rash.     Objective:   Physical Exam  Vital signs noted  Well developed well nourished female.  HEENT - Head atraumatic Normocephalic                Eyes - PERRLA, Conjuctiva - clear Sclera- Clear EOMI                Ears - EAC's Wnl TM's Wnl Gross Hearing WNL                Nose - Nares patent                 Throat - oropharanx wnl Respiratory - Lungs CTA bilateral Cardiac - RRR S1 and S2 without murmur GI - Abdomen soft Nontender and bowel sounds active x 4 Extremities - No edema. Neuro - Grossly intact.       Assessment & Plan:  Need for prophylactic vaccination and inoculation against influenza  HTN - Metoprolol 25mg  po bid #60w/11 rf  Lysbeth Penner FNP

## 2014-02-11 ENCOUNTER — Other Ambulatory Visit: Payer: Self-pay

## 2014-02-11 DIAGNOSIS — Z1231 Encounter for screening mammogram for malignant neoplasm of breast: Secondary | ICD-10-CM

## 2014-02-20 ENCOUNTER — Other Ambulatory Visit: Payer: Self-pay

## 2014-02-20 MED ORDER — METOPROLOL TARTRATE 25 MG PO TABS: 25.0000 mg | ORAL_TABLET | Freq: Two times a day (BID) | ORAL | Status: DC

## 2014-02-20 MED ORDER — LEVOTHYROXINE SODIUM 75 MCG PO TABS: 75.0000 ug | ORAL_TABLET | Freq: Every day | ORAL | Status: DC

## 2014-02-20 NOTE — Telephone Encounter (Signed)
Last seen 07/14/13 B [place]  Last thyroid level 10/04/12  This is for mail order

## 2014-02-25 ENCOUNTER — Ambulatory Visit
Admission: RE | Admit: 2014-02-25 | Discharge: 2014-02-25 | Disposition: A | Discharge status: Home / Self Care | Payer: BC Managed Care – PPO | Source: Ambulatory Visit

## 2014-02-25 DIAGNOSIS — Z1231 Encounter for screening mammogram for malignant neoplasm of breast: Secondary | ICD-10-CM

## 2014-04-27 ENCOUNTER — Encounter: Payer: Self-pay | Admitting: Family Medicine

## 2014-09-29 ENCOUNTER — Other Ambulatory Visit: Payer: Self-pay | Admitting: Family Medicine

## 2014-09-29 DIAGNOSIS — E039 Hypothyroidism, unspecified: Secondary | ICD-10-CM

## 2014-09-30 NOTE — Telephone Encounter (Signed)
Pt needs appt to have lab work drawn

## 2014-09-30 NOTE — Telephone Encounter (Signed)
Pt aware to come in for labwork

## 2014-09-30 NOTE — Telephone Encounter (Signed)
Last seen 07/04/14 B [place]   Last thyroid level 09/24/12

## 2014-10-08 ENCOUNTER — Other Ambulatory Visit: Payer: Self-pay

## 2015-12-13 ENCOUNTER — Other Ambulatory Visit: Payer: Self-pay | Admitting: Family

## 2015-12-13 ENCOUNTER — Other Ambulatory Visit: Payer: Self-pay | Admitting: Family Medicine

## 2015-12-14 MED ORDER — LEVOTHYROXINE SODIUM 75 MCG PO TABS: 75.0000 ug | ORAL_TABLET | Freq: Every day | ORAL | Status: DC

## 2015-12-14 NOTE — Telephone Encounter (Signed)
Prescription sent to pharmacy, PT needs to keep appt

## 2015-12-14 NOTE — Telephone Encounter (Signed)
She has not been seen since 2014, fill till appt or not

## 2015-12-24 ENCOUNTER — Ambulatory Visit (INDEPENDENT_AMBULATORY_CARE_PROVIDER_SITE_OTHER): Payer: BLUE CROSS/BLUE SHIELD | Admitting: Family

## 2015-12-24 ENCOUNTER — Encounter: Payer: Self-pay | Admitting: Family

## 2015-12-24 VITALS — BP 132/83 | HR 62 | Temp 97.9°F | Ht 66.0 in | Wt 108.8 lb

## 2015-12-24 DIAGNOSIS — IMO0001 Reserved for inherently not codable concepts without codable children: Secondary | ICD-10-CM

## 2015-12-24 DIAGNOSIS — Z13220 Encounter for screening for lipoid disorders: Secondary | ICD-10-CM | POA: Diagnosis not present

## 2015-12-24 DIAGNOSIS — Z1159 Encounter for screening for other viral diseases: Secondary | ICD-10-CM | POA: Diagnosis not present

## 2015-12-24 DIAGNOSIS — D509 Iron deficiency anemia, unspecified: Secondary | ICD-10-CM

## 2015-12-24 DIAGNOSIS — I10 Essential (primary) hypertension: Secondary | ICD-10-CM

## 2015-12-24 DIAGNOSIS — E039 Hypothyroidism, unspecified: Secondary | ICD-10-CM

## 2015-12-24 DIAGNOSIS — R636 Underweight: Secondary | ICD-10-CM | POA: Diagnosis not present

## 2015-12-24 DIAGNOSIS — Z136 Encounter for screening for cardiovascular disorders: Secondary | ICD-10-CM | POA: Diagnosis not present

## 2015-12-24 MED ORDER — LEVOTHYROXINE SODIUM 75 MCG PO TABS: 75.0000 ug | ORAL_TABLET | Freq: Every day | ORAL | Status: DC

## 2015-12-24 MED ORDER — METOPROLOL SUCCINATE ER 25 MG PO TB24: 25.0000 mg | ORAL_TABLET | Freq: Every day | ORAL | Status: DC

## 2015-12-24 NOTE — Patient Instructions (Signed)
Health Maintenance, Female Adopting a healthy lifestyle and getting preventive care can go a long way to promote health and wellness. Talk with your health care provider about what schedule of regular examinations is right for you. This is a good chance for you to check in with your provider about disease prevention and staying healthy. In between checkups, there are plenty of things you can do on your own. Experts have done a lot of research about which lifestyle changes and preventive measures are most likely to keep you healthy. Ask your health care provider for more information. WEIGHT AND DIET  Eat a healthy diet  Be sure to include plenty of vegetables, fruits, low-fat dairy products, and lean protein.  Do not eat a lot of foods high in solid fats, added sugars, or salt.  Get regular exercise. This is one of the most important things you can do for your health.  Most adults should exercise for at least 150 minutes each week. The exercise should increase your heart rate and make you sweat (moderate-intensity exercise).  Most adults should also do strengthening exercises at least twice a week. This is in addition to the moderate-intensity exercise.  Maintain a healthy weight  Body mass index (BMI) is a measurement that can be used to identify possible weight problems. It estimates body fat based on height and weight. Your health care provider can help determine your BMI and help you achieve or maintain a healthy weight.  For females 20 years of age and older:   A BMI below 18.5 is considered underweight.  A BMI of 18.5 to 24.9 is normal.  A BMI of 25 to 29.9 is considered overweight.  A BMI of 30 and above is considered obese.  Watch levels of cholesterol and blood lipids  You should start having your blood tested for lipids and cholesterol at 59 years of age, then have this test every 5 years.  You may need to have your cholesterol levels checked more often if:  Your lipid  or cholesterol levels are high.  You are older than 59 years of age.  You are at high risk for heart disease.  CANCER SCREENING   Lung Cancer  Lung cancer screening is recommended for adults 55-80 years old who are at high risk for lung cancer because of a history of smoking.  A yearly low-dose CT scan of the lungs is recommended for people who:  Currently smoke.  Have quit within the past 15 years.  Have at least a 30-pack-year history of smoking. A pack year is smoking an average of one pack of cigarettes a day for 1 year.  Yearly screening should continue until it has been 15 years since you quit.  Yearly screening should stop if you develop a health problem that would prevent you from having lung cancer treatment.  Breast Cancer  Practice breast self-awareness. This means understanding how your breasts normally appear and feel.  It also means doing regular breast self-exams. Let your health care provider know about any changes, no matter how small.  If you are in your 20s or 30s, you should have a clinical breast exam (CBE) by a health care provider every 1-3 years as part of a regular health exam.  If you are 40 or older, have a CBE every year. Also consider having a breast X-ray (mammogram) every year.  If you have a family history of breast cancer, talk to your health care provider about genetic screening.  If you   are at high risk for breast cancer, talk to your health care provider about having an MRI and a mammogram every year.  Breast cancer gene (BRCA) assessment is recommended for women who have family members with BRCA-related cancers. BRCA-related cancers include:  Breast.  Ovarian.  Tubal.  Peritoneal cancers.  Results of the assessment will determine the need for genetic counseling and BRCA1 and BRCA2 testing. Cervical Cancer Your health care provider may recommend that you be screened regularly for cancer of the pelvic organs (ovaries, uterus, and  vagina). This screening involves a pelvic examination, including checking for microscopic changes to the surface of your cervix (Pap test). You may be encouraged to have this screening done every 3 years, beginning at age 21.  For women ages 30-65, health care providers may recommend pelvic exams and Pap testing every 3 years, or they may recommend the Pap and pelvic exam, combined with testing for human papilloma virus (HPV), every 5 years. Some types of HPV increase your risk of cervical cancer. Testing for HPV may also be done on women of any age with unclear Pap test results.  Other health care providers may not recommend any screening for nonpregnant women who are considered low risk for pelvic cancer and who do not have symptoms. Ask your health care provider if a screening pelvic exam is right for you.  If you have had past treatment for cervical cancer or a condition that could lead to cancer, you need Pap tests and screening for cancer for at least 20 years after your treatment. If Pap tests have been discontinued, your risk factors (such as having a new sexual partner) need to be reassessed to determine if screening should resume. Some women have medical problems that increase the chance of getting cervical cancer. In these cases, your health care provider may recommend more frequent screening and Pap tests. Colorectal Cancer  This type of cancer can be detected and often prevented.  Routine colorectal cancer screening usually begins at 59 years of age and continues through 59 years of age.  Your health care provider may recommend screening at an earlier age if you have risk factors for colon cancer.  Your health care provider may also recommend using home test kits to check for hidden blood in the stool.  A small camera at the end of a tube can be used to examine your colon directly (sigmoidoscopy or colonoscopy). This is done to check for the earliest forms of colorectal  cancer.  Routine screening usually begins at age 50.  Direct examination of the colon should be repeated every 5-10 years through 59 years of age. However, you may need to be screened more often if early forms of precancerous polyps or small growths are found. Skin Cancer  Check your skin from head to toe regularly.  Tell your health care provider about any new moles or changes in moles, especially if there is a change in a mole's shape or color.  Also tell your health care provider if you have a mole that is larger than the size of a pencil eraser.  Always use sunscreen. Apply sunscreen liberally and repeatedly throughout the day.  Protect yourself by wearing long sleeves, pants, a wide-brimmed hat, and sunglasses whenever you are outside. HEART DISEASE, DIABETES, AND HIGH BLOOD PRESSURE   High blood pressure causes heart disease and increases the risk of stroke. High blood pressure is more likely to develop in:  People who have blood pressure in the high end   of the normal range (130-139/85-89 mm Hg).  People who are overweight or obese.  People who are African American.  If you are 38-23 years of age, have your blood pressure checked every 3-5 years. If you are 61 years of age or older, have your blood pressure checked every year. You should have your blood pressure measured twice--once when you are at a hospital or clinic, and once when you are not at a hospital or clinic. Record the average of the two measurements. To check your blood pressure when you are not at a hospital or clinic, you can use:  An automated blood pressure machine at a pharmacy.  A home blood pressure monitor.  If you are between 45 years and 39 years old, ask your health care provider if you should take aspirin to prevent strokes.  Have regular diabetes screenings. This involves taking a blood sample to check your fasting blood sugar level.  If you are at a normal weight and have a low risk for diabetes,  have this test once every three years after 59 years of age.  If you are overweight and have a high risk for diabetes, consider being tested at a younger age or more often. PREVENTING INFECTION  Hepatitis B  If you have a higher risk for hepatitis B, you should be screened for this virus. You are considered at high risk for hepatitis B if:  You were born in a country where hepatitis B is common. Ask your health care provider which countries are considered high risk.  Your parents were born in a high-risk country, and you have not been immunized against hepatitis B (hepatitis B vaccine).  You have HIV or AIDS.  You use needles to inject street drugs.  You live with someone who has hepatitis B.  You have had sex with someone who has hepatitis B.  You get hemodialysis treatment.  You take certain medicines for conditions, including cancer, organ transplantation, and autoimmune conditions. Hepatitis C  Blood testing is recommended for:  Everyone born from [birth year] through 1964.  Anyone with known risk factors for hepatitis C. Sexually transmitted infections (STIs)  You should be screened for sexually transmitted infections (STIs) including gonorrhea and chlamydia if:  You are sexually active and are younger than 59 years of age.  You are older than 59 years of age and your health care provider tells you that you are at risk for this type of infection.  Your sexual activity has changed since you were last screened and you are at an increased risk for chlamydia or gonorrhea. Ask your health care provider if you are at risk.  If you do not have HIV, but are at risk, it may be recommended that you take a prescription medicine daily to prevent HIV infection. This is called pre-exposure prophylaxis (PrEP). You are considered at risk if:  You are sexually active and do not regularly use condoms or know the HIV status of your partner(s).  You take drugs by injection.  You are sexually  active with a partner who has HIV. Talk with your health care provider about whether you are at high risk of being infected with HIV. If you choose to begin PrEP, you should first be tested for HIV. You should then be tested every 3 months for as long as you are taking PrEP.  PREGNANCY   If you are premenopausal and you may become pregnant, ask your health care provider about preconception counseling.  If you may  become pregnant, take 400 to 800 micrograms (mcg) of folic acid every day.  If you want to prevent pregnancy, talk to your health care provider about birth control (contraception). OSTEOPOROSIS AND MENOPAUSE   Osteoporosis is a disease in which the bones lose minerals and strength with aging. This can result in serious bone fractures. Your risk for osteoporosis can be identified using a bone density scan.  If you are 61 years of age or older, or if you are at risk for osteoporosis and fractures, ask your health care provider if you should be screened.  Ask your health care provider whether you should take a calcium or vitamin D supplement to lower your risk for osteoporosis.  Menopause may have certain physical symptoms and risks.  Hormone replacement therapy may reduce some of these symptoms and risks. Talk to your health care provider about whether hormone replacement therapy is right for you.  HOME CARE INSTRUCTIONS   Schedule regular health, dental, and eye exams.  Stay current with your immunizations.   Do not use any tobacco products including cigarettes, chewing tobacco, or electronic cigarettes.  If you are pregnant, do not drink alcohol.  If you are breastfeeding, limit how much and how often you drink alcohol.  Limit alcohol intake to no more than 1 drink per day for nonpregnant women. One drink equals 12 ounces of beer, 5 ounces of wine, or 1 ounces of hard liquor.  Do not use street drugs.  Do not share needles.  Ask your health care provider for help if  you need support or information about quitting drugs.  Tell your health care provider if you often feel depressed.  Tell your health care provider if you have ever been abused or do not feel safe at home.   This information is not intended to replace advice given to you by your health care provider. Make sure you discuss any questions you have with your health care provider.   Document Released: 12/26/2010 Document Revised: 07/03/2014 Document Reviewed: 05/14/2013 Elsevier Interactive Patient Education [employer].

## 2015-12-24 NOTE — Progress Notes (Signed)
Subjective:    Patient ID: Courtney Elliott, female    DOB: [birth year], 60 y.o.   MRN: [id number]  PT presents to the office today for chronic follow up. Hypertension This is a chronic problem. The current episode started more than 1 year ago. The problem has been waxing and waning since onset. The problem is controlled. Associated symptoms include malaise/fatigue. Pertinent negatives include no anxiety, blurred vision, headaches, palpitations, peripheral edema or shortness of breath. Risk factors for coronary artery disease include family history. Past treatments include beta blockers. The current treatment provides moderate improvement. There is no history of kidney disease, CAD/MI, CVA, heart failure or a thyroid problem. There is no history of sleep apnea.  Thyroid Problem Presents for follow-up visit. Patient reports no anxiety, depressed mood, diaphoresis, hair loss or palpitations. The symptoms have been resolved. Past treatments include levothyroxine. The treatment provided moderate relief. There is no history of heart failure.  Anemia Presents for follow-up visit. Symptoms include malaise/fatigue. There has been no bruising/bleeding easily or palpitations. Past treatments include oral iron supplements. There is no history of chronic liver disease or heart failure. Family history includes iron deficiency.      Review of Systems  Constitutional: Positive for malaise/fatigue. Negative for diaphoresis.  HENT: Negative.   Eyes: Negative.  Negative for blurred vision.  Respiratory: Negative.  Negative for shortness of breath.   Cardiovascular: Negative.  Negative for palpitations.  Gastrointestinal: Negative.   Endocrine: Negative.   Genitourinary: Negative.   Musculoskeletal: Negative.   Neurological: Negative.  Negative for headaches.  Hematological: Negative.  Does not bruise/bleed easily.  Psychiatric/Behavioral: Negative.  The patient is not nervous/anxious.   All other systems  reviewed and are negative.      Objective:   Physical Exam  Constitutional: She is oriented to person, place, and time. She appears well-developed and well-nourished. No distress.  HENT:  Head: Normocephalic and atraumatic.  Right Ear: External ear normal.  Left Ear: External ear normal.  Nose: Nose normal.  Mouth/Throat: Oropharynx is clear and moist.  Eyes: Pupils are equal, round, and reactive to light.  Neck: Normal range of motion. Neck supple. No thyromegaly present.  Cardiovascular: Normal rate, regular rhythm, normal heart sounds and intact distal pulses.   No murmur heard. Pulmonary/Chest: Effort normal and breath sounds normal. No respiratory distress. She has no wheezes.  Abdominal: Soft. Bowel sounds are normal. She exhibits no distension. There is no tenderness.  Musculoskeletal: Normal range of motion. She exhibits no edema or tenderness.  Neurological: She is alert and oriented to person, place, and time.  Skin: Skin is warm and dry.  Psychiatric: She has a normal mood and affect. Her behavior is normal. Judgment and thought content normal.  Vitals reviewed.     BP 129/91 mmHg  Pulse 79  Temp(Src) 97.9 F (36.6 C) (Oral)  Ht _0  (1.676 m)  Wt 108 lb 12.8 oz (49.351 kg)  BMI 17.57 kg/m2     Assessment & Plan:  1. Essential hypertension - CMP14+EGFR - metoprolol succinate (TOPROL-XL) 25 MG 24 hr tablet; Take 1 tablet (25 mg total) by mouth daily.  Dispense: 90 tablet; Refill: 3  2. Hypothyroidism, unspecified hypothyroidism type - CMP14+EGFR - Thyroid Panel With TSH - levothyroxine (SYNTHROID, LEVOTHROID) 75 MCG tablet; Take 1 tablet (75 mcg total) by mouth daily.  Dispense: 30 tablet; Refill: 0  3. Underweight - CMP14+EGFR  4. ANEMIA, IRON DEFICIENCY, CHRONIC - Anemia Profile B - CMP14+EGFR  5. Encounter for cholesteral  screening for cardiovascular disease - CMP14+EGFR - Lipid panel  6. Need for hepatitis C screening test - CMP14+EGFR -  Hepatitis C Antibody   Continue all meds Labs pending Health Maintenance reviewed Diet and exercise encouraged RTO 6 months  Evelina Dun, FNP

## 2015-12-25 LAB — CMP14+EGFR
ALT: 30 IU/L (ref 0–32)
AST: 32 IU/L (ref 0–40)
Albumin/Globulin Ratio: 1.3 (ref 1.2–2.2)
Albumin: 4.3 g/dL (ref 3.5–5.5)
Alkaline Phosphatase: 87 IU/L (ref 39–117)
BUN/Creatinine Ratio: 13 (ref 9–23)
BUN: 14 mg/dL (ref 6–24)
Bilirubin Total: 0.5 mg/dL (ref 0.0–1.2)
CO2: 26 mmol/L (ref 18–29)
CREATININE: 1.09 mg/dL — AB (ref 0.57–1.00)
Calcium: 9.5 mg/dL (ref 8.7–10.2)
Chloride: 101 mmol/L (ref 96–106)
GFR calc Af Amer: 65 mL/min/{1.73_m2} (ref >59)
GFR calc non Af Amer: 56 mL/min/{1.73_m2} — ABNORMAL LOW (ref >59)
GLUCOSE: 87 mg/dL (ref 65–99)
Globulin, Total: 3.2 g/dL (ref 1.5–4.5)
Potassium: 4.8 mmol/L (ref 3.5–5.2)
SODIUM: 139 mmol/L (ref 134–144)
Total Protein: 7.5 g/dL (ref 6.0–8.5)

## 2015-12-25 LAB — ANEMIA PROFILE B
BASOS ABS: 0 10*3/uL (ref 0.0–0.2)
Basos: 1 %
EOS (ABSOLUTE): 0.2 10*3/uL (ref 0.0–0.4)
Eos: 4 %
Ferritin: 80 ng/mL (ref 15–150)
Folate: 19.3 ng/mL (ref >3.0)
Hematocrit: 38.1 % (ref 34.0–46.6)
Hemoglobin: 11.6 g/dL (ref 11.1–15.9)
IMMATURE GRANULOCYTES: 0 %
Immature Grans (Abs): 0 10*3/uL (ref 0.0–0.1)
Iron Saturation: 47 % (ref 15–55)
Iron: 131 ug/dL (ref 27–159)
LYMPHS ABS: 2.4 10*3/uL (ref 0.7–3.1)
Lymphs: 61 %
MCH: 24.9 pg — AB (ref 26.6–33.0)
MCHC: 30.4 g/dL — AB (ref 31.5–35.7)
MCV: 82 fL (ref 79–97)
MONOS ABS: 0.3 10*3/uL (ref 0.1–0.9)
Monocytes: 7 %
Neutrophils Absolute: 1 10*3/uL — ABNORMAL LOW (ref 1.4–7.0)
Neutrophils: 27 %
PLATELETS: 179 10*3/uL (ref 150–379)
RBC: 4.66 x10E6/uL (ref 3.77–5.28)
RDW: 15.2 % (ref 12.3–15.4)
Retic Ct Pct: 1 % (ref 0.6–2.6)
Total Iron Binding Capacity: 279 ug/dL (ref 250–450)
UIBC: 148 ug/dL (ref 131–425)
VITAMIN B 12: 739 pg/mL (ref 211–946)
WBC: 3.8 10*3/uL (ref 3.4–10.8)

## 2015-12-25 LAB — THYROID PANEL WITH TSH
Free Thyroxine Index: 1.8 (ref 1.2–4.9)
T3 UPTAKE RATIO: 29 % (ref 24–39)
T4, Total: 6.2 ug/dL (ref 4.5–12.0)
TSH: 8.18 u[IU]/mL — ABNORMAL HIGH (ref 0.450–4.500)

## 2015-12-25 LAB — LIPID PANEL
Chol/HDL Ratio: 2.3 ratio units (ref 0.0–4.4)
Cholesterol, Total: 171 mg/dL (ref 100–199)
HDL: 76 mg/dL (ref >39)
LDL CALC: 80 mg/dL (ref 0–99)
Triglycerides: 73 mg/dL (ref 0–149)
VLDL Cholesterol Cal: 15 mg/dL (ref 5–40)

## 2015-12-25 LAB — HEPATITIS C ANTIBODY: Hep C Virus Ab: 0.1 s/co ratio (ref 0.0–0.9)

## 2016-02-19 ENCOUNTER — Other Ambulatory Visit: Payer: Self-pay | Admitting: Family

## 2016-02-19 DIAGNOSIS — E039 Hypothyroidism, unspecified: Secondary | ICD-10-CM

## 2016-05-08 ENCOUNTER — Other Ambulatory Visit: Payer: Self-pay | Admitting: *Deleted

## 2016-05-08 DIAGNOSIS — I10 Essential (primary) hypertension: Secondary | ICD-10-CM

## 2016-05-08 MED ORDER — METOPROLOL SUCCINATE ER 25 MG PO TB24: 25.0000 mg | ORAL_TABLET | Freq: Every day | ORAL | 3 refills | Status: DC

## 2016-05-08 MED ORDER — LEVOTHYROXINE SODIUM 75 MCG PO TABS: 75.0000 ug | ORAL_TABLET | Freq: Every day | ORAL | 3 refills | Status: DC

## 2016-06-28 ENCOUNTER — Ambulatory Visit: Payer: BLUE CROSS/BLUE SHIELD | Admitting: Family Medicine

## 2016-09-08 ENCOUNTER — Ambulatory Visit: Payer: BLUE CROSS/BLUE SHIELD | Admitting: Family Medicine

## 2016-09-08 ENCOUNTER — Ambulatory Visit (INDEPENDENT_AMBULATORY_CARE_PROVIDER_SITE_OTHER): Payer: BLUE CROSS/BLUE SHIELD | Admitting: Physician Assistant

## 2016-09-08 ENCOUNTER — Encounter: Payer: Self-pay | Admitting: Physician Assistant

## 2016-09-08 VITALS — BP 128/86 | HR 77 | Temp 97.0°F | Ht 66.0 in | Wt 111.4 lb

## 2016-09-08 DIAGNOSIS — M545 Low back pain, unspecified: Secondary | ICD-10-CM

## 2016-09-08 DIAGNOSIS — M542 Cervicalgia: Secondary | ICD-10-CM | POA: Diagnosis not present

## 2016-09-08 MED ORDER — PREDNISONE 10 MG (21) PO TBPK: ORAL_TABLET | ORAL | 0 refills | Status: DC

## 2016-09-08 NOTE — Patient Instructions (Signed)
Cervical Strain and Sprain Rehab Ask your health care provider which exercises are safe for you. Do exercises exactly as told by your health care provider and adjust them as directed. It is normal to feel mild stretching, pulling, tightness, or discomfort as you do these exercises, but you should stop right away if you feel sudden pain or your pain gets worse.Do not begin these exercises until told by your health care provider. Stretching and range of motion exercises These exercises warm up your muscles and joints and improve the movement and flexibility of your neck. These exercises also help to relieve pain, numbness, and tingling. Exercise A: Cervical side bend 1. Using good posture, sit on a stable chair or stand up. 2. Without moving your shoulders, slowly tilt your left / right ear to your shoulder until you feel a stretch in your neck muscles. You should be looking straight ahead. 3. Hold for __________ seconds. 4. Repeat with the other side of your neck. Repeat __________ times. Complete this exercise __________ times a day. Exercise B: Cervical rotation 1. Using good posture, sit on a stable chair or stand up. 2. Slowly turn your head to the side as if you are looking over your left / right shoulder.  Keep your eyes level with the ground.  Stop when you feel a stretch along the side and the back of your neck. 3. Hold for __________ seconds. 4. Repeat this by turning to your other side. Repeat __________ times. Complete this exercise __________ times a day. Exercise C: Thoracic extension and pectoral stretch 1. Roll a towel or a small blanket so it is about 4 inches (10 cm) in diameter. 2. Lie down on your back on a firm surface. 3. Put the towel lengthwise, under your spine in the middle of your back. It should not be not under your shoulder blades. The towel should line up with your spine from your middle back to your lower back. 4. Put your hands behind your head and let your  elbows fall out to your sides. 5. Hold for __________ seconds. Repeat __________ times. Complete this exercise __________ times a day. Strengthening exercises These exercises build strength and endurance in your neck. Endurance is the ability to use your muscles for a long time, even after your muscles get tired. Exercise D: Upper cervical flexion, isometric 1. Lie on your back with a thin pillow behind your head and a small rolled-up towel under your neck. 2. Gently tuck your chin toward your chest and nod your head down to look toward your feet. Do not lift your head off the pillow. 3. Hold for __________ seconds. 4. Release the tension slowly. Relax your neck muscles completely before you repeat this exercise. Repeat __________ times. Complete this exercise __________ times a day. Exercise E: Cervical extension, isometric 1. Stand about 6 inches (15 cm) away from a wall, with your back facing the wall. 2. Place a soft object, about 6-8 inches (15-20 cm) in diameter, between the back of your head and the wall. A soft object could be a small pillow, a ball, or a folded towel. 3. Gently tilt your head back and press into the soft object. Keep your jaw and forehead relaxed. 4. Hold for __________ seconds. 5. Release the tension slowly. Relax your neck muscles completely before you repeat this exercise. Repeat __________ times. Complete this exercise __________ times a day. Posture and body mechanics   Body mechanics refers to the movements and positions of your body   while you do your daily activities. Posture is part of body mechanics. Good posture and healthy body mechanics can help to relieve stress in your body's tissues and joints. Good posture means that your spine is in its natural S-curve position (your spine is neutral), your shoulders are pulled back slightly, and your head is not tipped forward. The following are general guidelines for applying improved posture and body mechanics to your  everyday activities. Standing  When standing, keep your spine neutral and keep your feet about hip-width apart. Keep a slight bend in your knees. Your ears, shoulders, and hips should line up.  When you do a task in which you stand in one place for a long time, place one foot up on a stable object that is 2-4 inches (5-10 cm) high, such as a footstool. This helps keep your spine neutral. Sitting  When sitting, keep your spine neutral and your keep feet flat on the floor. Use a footrest, if necessary, and keep your thighs parallel to the floor. Avoid rounding your shoulders, and avoid tilting your head forward.  When working at a desk or a computer, keep your desk at a height where your hands are slightly lower than your elbows. Slide your chair under your desk so you are close enough to maintain good posture.  When working at a computer, place your monitor at a height where you are looking straight ahead and you do not have to tilt your head forward or downward to look at the screen. Resting When lying down and resting, avoid positions that are most painful for you. Try to support your neck in a neutral position. You can use a contour pillow or a small rolled-up towel. Your pillow should support your neck but not push on it. This information is not intended to replace advice given to you by your health care provider. Make sure you discuss any questions you have with your health care provider. Document Released: 06/12/2005 Document Revised: 02/17/2016 Document Reviewed: 05/19/2015 Elsevier Interactive Patient Education  2017 [employer]  
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
## 2016-10-03 ENCOUNTER — Encounter: Payer: Self-pay | Admitting: Family Medicine

## 2016-10-03 ENCOUNTER — Ambulatory Visit (INDEPENDENT_AMBULATORY_CARE_PROVIDER_SITE_OTHER): Payer: BLUE CROSS/BLUE SHIELD

## 2016-10-03 ENCOUNTER — Ambulatory Visit (INDEPENDENT_AMBULATORY_CARE_PROVIDER_SITE_OTHER): Payer: BLUE CROSS/BLUE SHIELD | Admitting: Family Medicine

## 2016-10-03 VITALS — BP 109/73 | Temp 97.1°F | Ht 66.0 in | Wt 106.0 lb

## 2016-10-03 DIAGNOSIS — M25511 Pain in right shoulder: Secondary | ICD-10-CM | POA: Diagnosis not present

## 2016-10-03 DIAGNOSIS — M542 Cervicalgia: Secondary | ICD-10-CM

## 2016-10-03 DIAGNOSIS — M546 Pain in thoracic spine: Secondary | ICD-10-CM | POA: Diagnosis not present

## 2016-10-03 DIAGNOSIS — E039 Hypothyroidism, unspecified: Secondary | ICD-10-CM | POA: Diagnosis not present

## 2016-10-03 DIAGNOSIS — Z905 Acquired absence of kidney: Secondary | ICD-10-CM

## 2016-10-03 MED ORDER — METAXALONE 400 MG PO TABS: ORAL_TABLET | ORAL | 2 refills | Status: DC

## 2016-10-03 MED ORDER — BETAMETHASONE SOD PHOS & ACET 6 (3-3) MG/ML IJ SUSP
6.0000 mg | Freq: Once | INTRAMUSCULAR | Status: AC
Start: 2016-10-03 — End: 2016-10-03
  Administered 2016-10-03: 6 mg via INTRAMUSCULAR
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
## 2016-10-04 ENCOUNTER — Other Ambulatory Visit: Payer: Self-pay | Admitting: Family Medicine

## 2016-10-04 LAB — CMP14+EGFR
ALT: 21 IU/L (ref 0–32)
AST: 31 IU/L (ref 0–40)
Albumin/Globulin Ratio: 1.5 (ref 1.2–2.2)
Albumin: 4.5 g/dL (ref 3.5–5.5)
Alkaline Phosphatase: 92 IU/L (ref 39–117)
BILIRUBIN TOTAL: 0.4 mg/dL (ref 0.0–1.2)
BUN/Creatinine Ratio: 13 (ref 9–23)
BUN: 14 mg/dL (ref 6–24)
CO2: 26 mmol/L (ref 18–29)
Calcium: 9.7 mg/dL (ref 8.7–10.2)
Chloride: 103 mmol/L (ref 96–106)
Creatinine, Ser: 1.06 mg/dL — ABNORMAL HIGH (ref 0.57–1.00)
GFR calc Af Amer: 66 mL/min/{1.73_m2} (ref >59)
GFR calc non Af Amer: 58 mL/min/{1.73_m2} — ABNORMAL LOW (ref >59)
GLOBULIN, TOTAL: 3.1 g/dL (ref 1.5–4.5)
Glucose: 88 mg/dL (ref 65–99)
Potassium: 4.8 mmol/L (ref 3.5–5.2)
SODIUM: 143 mmol/L (ref 134–144)
Total Protein: 7.6 g/dL (ref 6.0–8.5)

## 2016-10-04 LAB — CBC WITH DIFFERENTIAL/PLATELET
BASOS ABS: 0 10*3/uL (ref 0.0–0.2)
BASOS: 1 %
EOS (ABSOLUTE): 0.2 10*3/uL (ref 0.0–0.4)
Eos: 5 %
HEMATOCRIT: 40.8 % (ref 34.0–46.6)
Hemoglobin: 12.6 g/dL (ref 11.1–15.9)
Immature Grans (Abs): 0 10*3/uL (ref 0.0–0.1)
Immature Granulocytes: 0 %
LYMPHS: 64 %
Lymphocytes Absolute: 2.6 10*3/uL (ref 0.7–3.1)
MCH: 25.5 pg — ABNORMAL LOW (ref 26.6–33.0)
MCHC: 30.9 g/dL — AB (ref 31.5–35.7)
MCV: 82 fL (ref 79–97)
MONOS ABS: 0.3 10*3/uL (ref 0.1–0.9)
Monocytes: 7 %
Neutrophils Absolute: 0.9 10*3/uL — ABNORMAL LOW (ref 1.4–7.0)
Neutrophils: 23 %
PLATELETS: 176 10*3/uL (ref 150–379)
RBC: 4.95 x10E6/uL (ref 3.77–5.28)
RDW: 15.4 % (ref 12.3–15.4)
WBC: 3.9 10*3/uL (ref 3.4–10.8)

## 2016-10-04 LAB — TSH+FREE T4
FREE T4: 1.22 ng/dL (ref 0.82–1.77)
TSH: 4.94 u[IU]/mL — ABNORMAL HIGH (ref 0.450–4.500)

## 2016-10-04 MED ORDER — LEVOTHYROXINE SODIUM 88 MCG PO TABS: 88.0000 ug | ORAL_TABLET | Freq: Every day | ORAL | 5 refills | Status: DC

## 2016-10-05 ENCOUNTER — Other Ambulatory Visit: Payer: Self-pay | Admitting: *Deleted

## 2016-10-05 DIAGNOSIS — E039 Hypothyroidism, unspecified: Secondary | ICD-10-CM

## 2016-10-20 ENCOUNTER — Ambulatory Visit: Payer: BLUE CROSS/BLUE SHIELD | Admitting: Family Medicine

## 2016-10-26 ENCOUNTER — Ambulatory Visit (INDEPENDENT_AMBULATORY_CARE_PROVIDER_SITE_OTHER): Payer: BLUE CROSS/BLUE SHIELD | Admitting: Family Medicine

## 2016-10-26 ENCOUNTER — Encounter: Payer: Self-pay | Admitting: Family Medicine

## 2016-10-26 VITALS — BP 110/77 | HR 78 | Temp 97.3°F | Ht 66.0 in | Wt 109.0 lb

## 2016-10-26 DIAGNOSIS — M25511 Pain in right shoulder: Secondary | ICD-10-CM

## 2016-10-26 NOTE — Progress Notes (Signed)
   Subjective:  Patient ID: Courtney Elliott, female    DOB: 08/03/1955  Age: 61 y.o. MRN: [id number]  CC: Shoulder Pain (pt here today c/o right shoulder pain that hurts if she lays on it wrong at night or certain ROM. )   HPI Courtney Elliott presents for Everything back to normal except that the right shoulder has significant pain with lifting. She did run out of physical therapy treatments with notable care in [place]. She would like to go back since the right shoulder is still a problem. However the neck and back are essentially normal. She has a good range of motion with the right upper extremity until she tries to lift something such as her pocketbook. This is a fairly large pocketbook she shows me and it probably weighs around 10 pounds.  History Courtney Elliott has a past medical history of Hypertension; Kidney disease; and Thyroid disease.   She has a past surgical history that includes Ablation.   Her family history includes Alcohol abuse in her father; Alzheimer's disease in her maternal aunt; Diabetes in her mother and sister; Heart disease in her mother; Heart disease (age of onset: 33) in her brother; Hypertension in her brother.She reports that she has never smoked. She has never used smokeless tobacco. She reports that she drinks alcohol. She reports that she does not use drugs.  Current Outpatient Prescriptions on File Prior to Visit  Medication Sig Dispense Refill  . CALCIUM-VITAMIN D PO Take by mouth.    . fish oil-omega-3 fatty acids 1000 MG capsule Take 2 g by mouth daily.    Marland Kitchen levothyroxine (SYNTHROID, LEVOTHROID) 88 MCG tablet Take 1 tablet (88 mcg total) by mouth daily. 30 tablet 5  . metoprolol succinate (TOPROL-XL) 25 MG 24 hr tablet Take 1 tablet (25 mg total) by mouth daily. 90 tablet 3   No current facility-administered medications on file prior to visit.     ROS Review of Systems  Constitutional: Negative for appetite change and fever.  HENT: Negative.     Respiratory: Negative.   Cardiovascular: Negative.   Musculoskeletal: Positive for arthralgias.    Objective:  BP 110/77   Pulse 78   Temp 97.3 F (36.3 C) (Oral)   Ht 5\' 6"  (1.676 m)   Wt 109 lb (49.4 kg)   BMI 17.59 kg/m   Physical Exam Full range of motion right upper extremity. No tenderness and no edema. There is pain at the distal insertion of the deltoid with resisted abduction. Assessment & Plan:   There are no diagnoses linked to this encounter. I have discontinued [name] cyclobenzaprine and metaxalone. I am also having her maintain her fish oil-omega-3 fatty acids, CALCIUM-VITAMIN D PO, metoprolol succinate, and levothyroxine.  No orders of the defined types were placed in this encounter.    Follow-up: Return in about 2 weeks (around 11/09/2016), or if symptoms worsen or fail to improve.  Courtney Elliott, M.D.

## 2016-10-27 ENCOUNTER — Telehealth: Payer: Self-pay

## 2016-10-27 NOTE — Telephone Encounter (Signed)
Pt is needing a referral for shoulder pain. Was seen by you on 10/26/16 and thought this was going to be done. Please advise

## 2016-10-29 NOTE — Telephone Encounter (Signed)
Please schedule if pt is willing. Document if pt. refuses. Thanks, WS

## 2016-10-31 NOTE — Telephone Encounter (Signed)
Please refer as requested Thanks, WS 

## 2016-11-03 NOTE — Telephone Encounter (Signed)
Information sent to PT in [place] on WEdnesday. Tried to call pt to let her know, but vm was full

## 2016-11-07 NOTE — Telephone Encounter (Signed)
Pt aware information has been faxed and a verification was received

## 2016-11-27 ENCOUNTER — Ambulatory Visit: Payer: BLUE CROSS/BLUE SHIELD | Admitting: Family Medicine

## 2017-04-13 ENCOUNTER — Other Ambulatory Visit: Payer: Self-pay | Admitting: Family

## 2017-04-13 DIAGNOSIS — I10 Essential (primary) hypertension: Secondary | ICD-10-CM

## 2017-04-20 ENCOUNTER — Other Ambulatory Visit: Payer: Self-pay | Admitting: *Deleted

## 2017-04-20 MED ORDER — LEVOTHYROXINE SODIUM 88 MCG PO TABS: 88.0000 ug | ORAL_TABLET | Freq: Every day | ORAL | 0 refills | Status: DC

## 2017-04-21 ENCOUNTER — Other Ambulatory Visit: Payer: Self-pay | Admitting: Family Medicine

## 2017-04-25 DIAGNOSIS — B9789 Other viral agents as the cause of diseases classified elsewhere: Secondary | ICD-10-CM | POA: Insufficient documentation

## 2017-04-25 DIAGNOSIS — Z8679 Personal history of other diseases of the circulatory system: Secondary | ICD-10-CM | POA: Insufficient documentation

## 2017-06-12 ENCOUNTER — Other Ambulatory Visit: Payer: Self-pay | Admitting: *Deleted

## 2017-06-17 ENCOUNTER — Other Ambulatory Visit: Payer: Self-pay | Admitting: Family Medicine

## 2017-06-20 NOTE — Telephone Encounter (Signed)
Per last fill, patient must be seen for further fills.  I have sent in a 1 week supply.  No more fills until she is seen, as her last TSH was still not in range.  This needs to be rechecked.

## 2017-06-20 NOTE — Telephone Encounter (Signed)
lmtcb

## 2017-06-20 NOTE — Telephone Encounter (Signed)
Last thyroid 12/24/15  Dr Dettinger

## 2017-07-20 ENCOUNTER — Encounter: Payer: Self-pay | Admitting: Family Medicine

## 2017-07-20 ENCOUNTER — Ambulatory Visit: Payer: BLUE CROSS/BLUE SHIELD | Admitting: Family Medicine

## 2017-07-20 VITALS — BP 117/74 | HR 80 | Temp 97.3°F | Ht 66.0 in | Wt 115.0 lb

## 2017-07-20 DIAGNOSIS — I10 Essential (primary) hypertension: Secondary | ICD-10-CM | POA: Diagnosis not present

## 2017-07-20 DIAGNOSIS — E039 Hypothyroidism, unspecified: Secondary | ICD-10-CM

## 2017-07-20 DIAGNOSIS — R05 Cough: Secondary | ICD-10-CM | POA: Diagnosis not present

## 2017-07-20 DIAGNOSIS — R059 Cough, unspecified: Secondary | ICD-10-CM

## 2017-07-20 MED ORDER — AZITHROMYCIN 250 MG PO TABS: ORAL_TABLET | ORAL | 0 refills | Status: DC

## 2017-07-20 MED ORDER — BETAMETHASONE SOD PHOS & ACET 6 (3-3) MG/ML IJ SUSP
6.0000 mg | Freq: Once | INTRAMUSCULAR | Status: AC
  Administered 2017-07-20: 6 mg via INTRAMUSCULAR

## 2017-07-20 MED ORDER — METOPROLOL SUCCINATE ER 25 MG PO TB24: 25.0000 mg | ORAL_TABLET | Freq: Every day | ORAL | 1 refills | Status: DC

## 2017-07-20 MED ORDER — LEVOTHYROXINE SODIUM 88 MCG PO TABS: 88.0000 ug | ORAL_TABLET | Freq: Every day | ORAL | 1 refills | Status: DC

## 2017-07-20 NOTE — Progress Notes (Signed)
Chief Complaint  Patient presents with  . Hypertension    pt here today for routine follow up of her chronic medical conditions and c/o cough and congestion    HPI  Patient presents today for Patient presents with upper respiratory congestion.  This is been ongoing for about 3 months.  It started with a cold and cough all of the symptoms have pretty much resolved except the cough.  It just keeps hanging on. Patient reports coughing frequently as well.  No sputum noted. There is no fever, chills or sweats. The patient denies being short of breath. Onset was 3-5 days ago. Gradually worsening. Tried OTCs without improvement. Patient presents for follow-up on  thyroid. The patient has a history of hypothyroidism for many years. It has been stable recently. Pt. denies any change in  voice, loss of hair, heat or cold intolerance. Energy level has been adequate to good. Patient denies constipation and diarrhea. No myxedema. Medication is as noted below. Verified that pt is taking it daily on an empty stomach. Well tolerated.  Patient however wants to discontinue this medicine.  She just does not like to take pills.  She denies any side effects.  PMH: Smoking status noted ROS: Review of Systems  Constitutional: Negative for activity change, appetite change and fever.  HENT: Negative for congestion, rhinorrhea and sore throat.   Eyes: Negative for visual disturbance.  Respiratory: Negative for cough and shortness of breath.   Cardiovascular: Negative for chest pain and palpitations.  Gastrointestinal: Negative for abdominal pain, diarrhea and nausea.  Genitourinary: Negative for dysuria.  Musculoskeletal: Negative for arthralgias and myalgias.     Objective: BP 117/74   Pulse 80   Temp (!) 97.3 F (36.3 C) (Oral)   Ht 5' 6"  (1.676 m)   Wt 115 lb (52.2 kg)   BMI 18.56 kg/m  Gen: NAD, alert, cooperative with exam HEENT: NCAT, Nasal passages clear CV: RRR, good S1/S2, no murmur Resp: Clear to  auscultation Ext: No edema, warm Neuro: Alert and oriented, No gross deficits  Assessment and plan:  1. Acquired hypothyroidism   2. Essential hypertension   3. Cough     Meds ordered this encounter  Medications  . betamethasone acetate-betamethasone sodium phosphate (CELESTONE) injection 6 mg  . metoprolol succinate (TOPROL-XL) 25 MG 24 hr tablet    Sig: Take 1 tablet (25 mg total) by mouth daily.    Dispense:  90 tablet    Refill:  1  . levothyroxine (SYNTHROID, LEVOTHROID) 88 MCG tablet    Sig: Take 1 tablet (88 mcg total) by mouth daily.    Dispense:  90 tablet    Refill:  1    Must be seen for further refills.  Marland Kitchen azithromycin (ZITHROMAX Z-PAK) 250 MG tablet    Sig: Take two right away Then one a day for the next 4 days.    Dispense:  6 each    Refill:  0    Orders Placed This Encounter  Procedures  . CMP14+EGFR  . TSH  . T4, Free    Patient was cautioned that the thyroid medicine is essential when her body does not make adequate amounts.  I stringently suggested that discontinue her thyroid medication would be quite harmful.  She voiced understanding and intends to comply.  Follow-up in 6 months Claretta Fraise, MD

## 2017-07-21 LAB — CMP14+EGFR
ALBUMIN: 4.2 g/dL (ref 3.6–4.8)
ALT: 36 IU/L — ABNORMAL HIGH (ref 0–32)
AST: 67 IU/L — ABNORMAL HIGH (ref 0–40)
Albumin/Globulin Ratio: 1.4 (ref 1.2–2.2)
Alkaline Phosphatase: 88 IU/L (ref 39–117)
BUN/Creatinine Ratio: 17 (ref 12–28)
BUN: 15 mg/dL (ref 8–27)
Bilirubin Total: 0.3 mg/dL (ref 0.0–1.2)
CO2: 24 mmol/L (ref 20–29)
CREATININE: 0.88 mg/dL (ref 0.57–1.00)
Calcium: 8.8 mg/dL (ref 8.7–10.3)
Chloride: 105 mmol/L (ref 96–106)
GFR calc Af Amer: 83 mL/min/{1.73_m2} (ref >59)
GFR calc non Af Amer: 72 mL/min/{1.73_m2} (ref >59)
Globulin, Total: 2.9 g/dL (ref 1.5–4.5)
Glucose: 97 mg/dL (ref 65–99)
Potassium: 4.1 mmol/L (ref 3.5–5.2)
SODIUM: 142 mmol/L (ref 134–144)
Total Protein: 7.1 g/dL (ref 6.0–8.5)

## 2017-07-21 LAB — T4, FREE: Free T4: 1.61 ng/dL (ref 0.82–1.77)

## 2017-07-21 LAB — TSH: TSH: 1.78 u[IU]/mL (ref 0.450–4.500)

## 2017-07-23 ENCOUNTER — Other Ambulatory Visit: Payer: Self-pay | Admitting: *Deleted

## 2017-07-23 DIAGNOSIS — R945 Abnormal results of liver function studies: Principal | ICD-10-CM

## 2017-07-23 DIAGNOSIS — R7989 Other specified abnormal findings of blood chemistry: Secondary | ICD-10-CM

## 2017-07-31 ENCOUNTER — Encounter: Payer: Self-pay | Admitting: Gastroenterology

## 2017-07-31 ENCOUNTER — Other Ambulatory Visit: Payer: BLUE CROSS/BLUE SHIELD

## 2017-07-31 DIAGNOSIS — R7989 Other specified abnormal findings of blood chemistry: Secondary | ICD-10-CM

## 2017-07-31 DIAGNOSIS — R945 Abnormal results of liver function studies: Principal | ICD-10-CM

## 2017-08-01 LAB — HEPATIC FUNCTION PANEL
ALK PHOS: 90 IU/L (ref 39–117)
ALT: 23 IU/L (ref 0–32)
AST: 24 IU/L (ref 0–40)
Albumin: 4.3 g/dL (ref 3.6–4.8)
BILIRUBIN TOTAL: 0.4 mg/dL (ref 0.0–1.2)
BILIRUBIN, DIRECT: 0.11 mg/dL (ref 0.00–0.40)
Total Protein: 7.8 g/dL (ref 6.0–8.5)

## 2017-10-10 ENCOUNTER — Other Ambulatory Visit: Payer: Self-pay | Admitting: *Deleted

## 2017-10-10 DIAGNOSIS — I10 Essential (primary) hypertension: Secondary | ICD-10-CM

## 2017-10-10 MED ORDER — METOPROLOL SUCCINATE ER 25 MG PO TB24: 25.0000 mg | ORAL_TABLET | Freq: Every day | ORAL | 0 refills | Status: DC

## 2017-10-10 MED ORDER — LEVOTHYROXINE SODIUM 88 MCG PO TABS: 88.0000 ug | ORAL_TABLET | Freq: Every day | ORAL | 1 refills | Status: DC

## 2017-10-10 NOTE — Addendum Note (Signed)
Addended by: Antonietta Barcelona D on: 10/10/2017 10:40 AM   Modules accepted: Orders

## 2017-12-17 ENCOUNTER — Other Ambulatory Visit: Payer: Self-pay | Admitting: Family Medicine

## 2017-12-17 DIAGNOSIS — I10 Essential (primary) hypertension: Secondary | ICD-10-CM

## 2017-12-18 NOTE — Telephone Encounter (Signed)
Last seen 05/30/18

## 2018-01-18 ENCOUNTER — Ambulatory Visit: Payer: BLUE CROSS/BLUE SHIELD | Admitting: Family Medicine

## 2018-03-25 ENCOUNTER — Other Ambulatory Visit: Payer: Self-pay | Admitting: Family Medicine

## 2018-03-25 DIAGNOSIS — I10 Essential (primary) hypertension: Secondary | ICD-10-CM
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
## 2018-06-24 ENCOUNTER — Other Ambulatory Visit: Payer: Self-pay | Admitting: Family Medicine

## 2018-06-24 DIAGNOSIS — I10 Essential (primary) hypertension: Secondary | ICD-10-CM

## 2018-06-24 NOTE — Telephone Encounter (Signed)
Last seen 05/30/18

## 2018-09-02 ENCOUNTER — Other Ambulatory Visit: Payer: Self-pay | Admitting: Family Medicine

## 2018-09-02 DIAGNOSIS — I10 Essential (primary) hypertension: Secondary | ICD-10-CM

## 2018-09-20 ENCOUNTER — Encounter: Payer: Self-pay | Admitting: Family Medicine

## 2018-09-20 ENCOUNTER — Ambulatory Visit (INDEPENDENT_AMBULATORY_CARE_PROVIDER_SITE_OTHER): Payer: BLUE CROSS/BLUE SHIELD | Admitting: Family Medicine

## 2018-09-20 ENCOUNTER — Telehealth: Payer: Self-pay | Admitting: Family Medicine

## 2018-09-20 ENCOUNTER — Other Ambulatory Visit: Payer: Self-pay

## 2018-09-20 DIAGNOSIS — J40 Bronchitis, not specified as acute or chronic: Secondary | ICD-10-CM | POA: Diagnosis not present

## 2018-09-20 MED ORDER — AMOXICILLIN-POT CLAVULANATE 875-125 MG PO TABS: 1.0000 | ORAL_TABLET | Freq: Two times a day (BID) | ORAL | 0 refills | Status: DC

## 2018-09-20 MED ORDER — CEFDINIR 300 MG PO CAPS: 300.0000 mg | ORAL_CAPSULE | Freq: Two times a day (BID) | ORAL | 0 refills | Status: DC

## 2018-09-20 MED ORDER — PREDNISONE 20 MG PO TABS: ORAL_TABLET | ORAL | 0 refills | Status: DC

## 2018-09-20 NOTE — Telephone Encounter (Signed)
Pt notified of new RX Verbalizes understanding

## 2018-09-20 NOTE — Telephone Encounter (Signed)
The likelihood of that causing any issues is very unlikely because of the only small amount of potassium for a short period of time but I sent in a different antibiotic if she wants to try that one instead, the problem is a lot of antibiotics are either combined with potassium or magnesium because there salts and that is how they absorb.

## 2018-09-20 NOTE — Progress Notes (Signed)
Virtual Visit via telephone Note  I connected with Courtney Elliott on 09/20/18 at 1030 by telephone and verified that I am speaking with the correct person using two identifiers. Courtney Elliott is currently located at home and no other people are currently with her during visit. The provider, Fransisca Kaufmann , MD is located in their office at time of visit.  Call ended at 1040  I discussed the limitations, risks, security and privacy concerns of performing an evaluation and management service by telephone and the availability of in person appointments. I also discussed with the patient that there may be a patient responsible charge related to this service. The patient expressed understanding and agreed to proceed.   History and Present Illness: Last month was sick with fever and sore throat and hoarseness and march 6th did an e-visit and was prescribed prednisone and albuterol and tessalon Perles and was told she had bronchitis and has taken since march 9th.  She did improve over first week.  She is also taking mucinex.  She is still have coughing and mucous production and it is just not improving recently.  Cant have conversation without coughing spells. 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   No diagnosis found.  Outpatient Encounter Medications as of 09/20/2018  Medication Sig  . Aluminum & Magnesium Hydroxide (MAGNESIUM-ALUMINUM PO) Take by mouth.  Marland Kitchen azithromycin (ZITHROMAX Z-PAK) 250 MG tablet Take two right away Then one a day for the next 4 days.  . fish oil-omega-3 fatty acids 1000 MG capsule Take 2 g by mouth daily.  Marland Kitchen glucosamine-chondroitin 500-400 MG tablet Take 1 tablet by mouth 3 (three) times daily.  Marland Kitchen levothyroxine (SYNTHROID, LEVOTHROID) 88 MCG tablet TAKE 1 TABLET DAILY  . Methylsulfonylmethane (MSM PO) Take by mouth.  . metoprolol succinate (TOPROL-XL) 25 MG 24 hr tablet TAKE 1 TABLET DAILY (NEED TO BE SEEN BEFORE NEXT REFILL)  . VITAMIN D,  ERGOCALCIFEROL, PO Take by mouth.   No facility-administered encounter medications on file as of 09/20/2018.     Review of Systems  Constitutional: Negative for chills and fever.  HENT: Positive for congestion, sore throat and voice change. Negative for sinus pressure, sinus pain and sneezing.   Eyes: Negative for visual disturbance.  Respiratory: Positive for cough. Negative for chest tightness and shortness of breath.   Cardiovascular: Negative for chest pain and leg swelling.  Musculoskeletal: Negative for back pain and gait problem.  Skin: Negative for color change and rash.  Neurological: Negative for light-headedness and headaches.  Psychiatric/Behavioral: Negative for agitation and behavioral problems.  All other systems reviewed and are negative.   Observations/Objective: Patient is coughing but does not sound short of breath on the phone and does not sound in distress.  Assessment and Plan: Problem List Items Addressed This Visit    None    Visit Diagnoses    Bronchitis    -  Primary   Relevant Medications   amoxicillin-clavulanate (AUGMENTIN) 875-125 MG tablet   predniSONE (DELTASONE) 20 MG tablet       Follow Up Instructions: It sounds like she may have been exposed a month ago from a coworker and isolated been but still just not fully improving and still having the coughing.  Recommended to still self quarantine but use the Augmentin and the prednisone and still use the cough medicine   If she worsens she was recommended to go to the emergency department  I discussed the assessment and treatment  plan with the patient. The patient was provided an opportunity to ask questions and all were answered. The patient agreed with the plan and demonstrated an understanding of the instructions.   The patient was advised to call back or seek an in-person evaluation if the symptoms worsen or if the condition fails to improve as anticipated.  The above assessment and management  plan was discussed with the patient. The patient verbalized understanding of and has agreed to the management plan. Patient is aware to call the clinic if symptoms persist or worsen. Patient is aware when to return to the clinic for a follow-up visit. Patient educated on when it is appropriate to go to the emergency department.    I provided 10 minutes of non-face-to-face time during this encounter.    Worthy Rancher, MD

## 2018-10-11 ENCOUNTER — Encounter: Payer: Self-pay | Admitting: Family Medicine

## 2018-11-08 ENCOUNTER — Ambulatory Visit: Payer: BLUE CROSS/BLUE SHIELD | Admitting: Family Medicine

## 2019-02-07 ENCOUNTER — Other Ambulatory Visit: Payer: Self-pay | Admitting: Family Medicine

## 2019-02-07 DIAGNOSIS — I10 Essential (primary) hypertension: Secondary | ICD-10-CM

## 2019-03-27 ENCOUNTER — Other Ambulatory Visit: Payer: Self-pay

## 2019-03-28 ENCOUNTER — Encounter: Payer: Self-pay | Admitting: Gastroenterology

## 2019-03-28 ENCOUNTER — Encounter: Payer: Self-pay | Admitting: Family Medicine

## 2019-03-28 ENCOUNTER — Ambulatory Visit: Payer: BLUE CROSS/BLUE SHIELD | Admitting: Family Medicine

## 2019-03-28 VITALS — BP 150/90 | HR 67 | Temp 97.5°F | Ht 66.0 in | Wt 111.0 lb

## 2019-03-28 DIAGNOSIS — R636 Underweight: Secondary | ICD-10-CM

## 2019-03-28 DIAGNOSIS — Z905 Acquired absence of kidney: Secondary | ICD-10-CM

## 2019-03-28 DIAGNOSIS — Z23 Encounter for immunization: Secondary | ICD-10-CM

## 2019-03-28 DIAGNOSIS — Z1211 Encounter for screening for malignant neoplasm of colon: Secondary | ICD-10-CM

## 2019-03-28 DIAGNOSIS — K429 Umbilical hernia without obstruction or gangrene: Secondary | ICD-10-CM

## 2019-03-28 DIAGNOSIS — E039 Hypothyroidism, unspecified: Secondary | ICD-10-CM | POA: Diagnosis not present

## 2019-03-28 DIAGNOSIS — I10 Essential (primary) hypertension: Secondary | ICD-10-CM | POA: Diagnosis not present

## 2019-03-28 MED ORDER — METOPROLOL SUCCINATE ER 25 MG PO TB24: ORAL_TABLET | ORAL | 3 refills | Status: DC

## 2019-03-28 NOTE — Patient Instructions (Signed)

## 2019-03-28 NOTE — Progress Notes (Signed)
Subjective: CC: est care, Thyroid disorder PCP: Janora Norlander, DO XIP:JASNKNL Bents is a 63 y.o. female presenting to clinic today for:  1.  Thyroid disorder  History: Onset in 7s.  No history of radiation or surgery to the neck.  No family history of thyroid disorder Patient reports compliance with Synthroid 88 mcg daily.  Denies any heart palpitations, unplanned weight fluctuations, change in bowel habits.  2.  PVCs/hypertension/mitral valve prolapse Patient reports she has been out of her Toprol XL 25 mg for about a week now.  She denies any heart palpitations, chest pain, shortness of breath, change in energy.  She continues to run about an hour per day and tries to maintain a healthy diet.  She works as an Scientist, product/process development and sometimes consumes prepackaged foods which contain salt but she on average never add salt to her diet.  Blood pressures at home tend to be around 111/80.  3.  Underweight/osteopenia She had DEXA scan in 2015 and T score was noted to be -1.8.  She is compliant with vitamin D, multivitamin.  She is wondering if she should add calcium supplementation as well.  4.  Preventative care Patient due for colonoscopy.  She had one about 11 years ago with [facility] and was told that she had benign polyps.  Recommendation was follow-up in 10 years.  Additionally, she gets her Pap smears and mammograms done in [place] with Dr. Leo Grosser, who since retired.  She is going to schedule an appointment with Dr. Caralee Ates replacement to have these done soon.  She does not report any breast or vaginal concerns today.  She denies any rectal bleeding, unplanned weight loss, night sweats or other GI symptoms.   ROS: Per HPI  No Known Allergies Past Medical History:  Diagnosis Date  . Hypertension   . Kidney disease    Pt has 1 kidney  . Thyroid disease    hypothyroid    Current Outpatient Medications:  .  Aluminum & Magnesium Hydroxide (MAGNESIUM-ALUMINUM PO), Take by  mouth., Disp: , Rfl:  .  amoxicillin-clavulanate (AUGMENTIN) 875-125 MG tablet, Take 1 tablet by mouth 2 (two) times daily., Disp: 20 tablet, Rfl: 0 .  cefdinir (OMNICEF) 300 MG capsule, Take 1 capsule (300 mg total) by mouth 2 (two) times daily. 1 po BID, Disp: 20 capsule, Rfl: 0 .  fish oil-omega-3 fatty acids 1000 MG capsule, Take 2 g by mouth daily., Disp: , Rfl:  .  glucosamine-chondroitin 500-400 MG tablet, Take 1 tablet by mouth 3 (three) times daily., Disp: , Rfl:  .  levothyroxine (SYNTHROID, LEVOTHROID) 88 MCG tablet, TAKE 1 TABLET DAILY, Disp: 90 tablet, Rfl: 2 .  Methylsulfonylmethane (MSM PO), Take by mouth., Disp: , Rfl:  .  metoprolol succinate (TOPROL-XL) 25 MG 24 hr tablet, TAKE 1 TABLET DAILY (NEED TO BE SEEN BEFORE NEXT REFILL), Disp: 30 tablet, Rfl: 3 .  Multiple Vitamin (MULTIVITAMIN) capsule, Take 1 capsule by mouth daily., Disp: , Rfl:  .  predniSONE (DELTASONE) 20 MG tablet, 2 po at same time daily for 5 days, Disp: 10 tablet, Rfl: 0 .  Specialty Vitamins Products (MAGNESIUM, AMINO ACID CHELATE,) 133 MG tablet, Take 1 tablet by mouth 2 (two) times daily., Disp: , Rfl:  .  VITAMIN D, ERGOCALCIFEROL, PO, Take by mouth., Disp: , Rfl:  Social History   Socioeconomic History  . Marital status: Married    Spouse name: Not on file  . Number of children: Not on file  . Years  of education: Not on file  . Highest education level: Not on file  Occupational History  . Not on file  Social Needs  . Financial resource strain: Not on file  . Food insecurity    Worry: Not on file    Inability: Not on file  . Transportation needs    Medical: Not on file    Non-medical: Not on file  Tobacco Use  . Smoking status: Never Smoker  . Smokeless tobacco: Never Used  Substance and Sexual Activity  . Alcohol use: Yes    Comment: occasional   . Drug use: No  . Sexual activity: Not on file    Comment: husband vasectomy   Lifestyle  . Physical activity    Days per week: Not on file     Minutes per session: Not on file  . Stress: Not on file  Relationships  . Social Herbalist on phone: Not on file    Gets together: Not on file    Attends religious service: Not on file    Active member of club or organization: Not on file    Attends meetings of clubs or organizations: Not on file    Relationship status: Not on file  . Intimate partner violence    Fear of current or ex partner: Not on file    Emotionally abused: Not on file    Physically abused: Not on file    Forced sexual activity: Not on file  Other Topics Concern  . Not on file  Social History Narrative  . Not on file   Family History  Problem Relation Age of Onset  . Diabetes Mother   . Heart disease Mother   . Alcohol abuse Father   . Diabetes Sister   . Heart disease Brother 77  . Hypertension Brother   . Alzheimer's disease Maternal Aunt     Objective: Office vital signs reviewed. BP (!) 150/90   Pulse 67   Temp (!) 97.5 F (36.4 C) (Temporal)   Ht 5' 6"  (1.676 m)   Wt 111 lb (50.3 kg)   SpO2 100%   BMI 17.92 kg/m   Physical Examination:  General: Awake, alert, well appearing, fit female, appears younger than stated age, No acute distress HEENT: Normal.  No goiter.  No except almost Cardio: regular rate and rhythm, S1S2 heard, no murmurs appreciated Pulm: clear to auscultation bilaterally, no wheezes, rhonchi or rales; normal work of breathing on room air GI: Flat, chickpea sized reducible supraumbilical hernia noted.  Nontender, nonerythematous. Extremities: warm, well perfused, No edema, cyanosis or clubbing; +2 pulses bilaterally Neuro: No resting tremor noted  Assessment/ Plan: 63 y.o. female   1. Acquired hypothyroidism Check thyroid panel, fasting lipid panel.  We will renew Synthroid based on results - Thyroid Panel With TSH - Lipid Panel  2. Essential hypertension Not controlled but has been out of her medicine for about a week now.  Home blood pressure sounds  controlled.  Toprol renewed.  Follow-up in 3 months - CMP14+EGFR - metoprolol succinate (TOPROL-XL) 25 MG 24 hr tablet; TAKE 1 TABLET DAILY  Dispense: 90 tablet; Refill: 3  3. Single kidney - CMP14+EGFR - VITAMIN D 25 Hydroxy (Vit-D Deficiency, Fractures) - CBC  4. Underweight Her BMI has gone down some since her last visit here.  I recommend repeating DEXA scan.  Handout was provided with regards to adequate calcium and vitamin D.  We will check these with today's labs as well -  CMP14+EGFR - VITAMIN D 25 Hydroxy (Vit-D Deficiency, Fractures) - CBC - Thyroid Panel With TSH  5. Umbilical hernia without obstruction and without gangrene Will refer to general surgery for evaluation - Ambulatory referral to General Surgery  6. Screen for colon cancer Asymptomatic. - Ambulatory referral to Gastroenterology   No orders of the defined types were placed in this encounter.  No orders of the defined types were placed in this encounter.    Janora Norlander, DO [place] [phone]

## 2019-03-29 LAB — CBC
Hematocrit: 38.6 % (ref 34.0–46.6)
Hemoglobin: 11.7 g/dL (ref 11.1–15.9)
MCH: 25.4 pg — ABNORMAL LOW (ref 26.6–33.0)
MCHC: 30.3 g/dL — ABNORMAL LOW (ref 31.5–35.7)
MCV: 84 fL (ref 79–97)
Platelets: 180 10*3/uL (ref 150–450)
RBC: 4.61 x10E6/uL (ref 3.77–5.28)
RDW: 12.9 % (ref 11.7–15.4)
WBC: 3.4 10*3/uL (ref 3.4–10.8)

## 2019-03-29 LAB — CMP14+EGFR
ALT: 15 IU/L (ref 0–32)
AST: 24 IU/L (ref 0–40)
Albumin/Globulin Ratio: 1.4 (ref 1.2–2.2)
Albumin: 4.2 g/dL (ref 3.8–4.8)
Alkaline Phosphatase: 90 IU/L (ref 39–117)
BUN/Creatinine Ratio: 9 — ABNORMAL LOW (ref 12–28)
BUN: 9 mg/dL (ref 8–27)
Bilirubin Total: 0.4 mg/dL (ref 0.0–1.2)
CO2: 25 mmol/L (ref 20–29)
Calcium: 9.4 mg/dL (ref 8.7–10.3)
Chloride: 103 mmol/L (ref 96–106)
Creatinine, Ser: 0.97 mg/dL (ref 0.57–1.00)
GFR calc Af Amer: 72 mL/min/{1.73_m2} (ref >59)
GFR calc non Af Amer: 63 mL/min/{1.73_m2} (ref >59)
Globulin, Total: 3.1 g/dL (ref 1.5–4.5)
Glucose: 91 mg/dL (ref 65–99)
Potassium: 4.5 mmol/L (ref 3.5–5.2)
Sodium: 140 mmol/L (ref 134–144)
Total Protein: 7.3 g/dL (ref 6.0–8.5)

## 2019-03-29 LAB — THYROID PANEL WITH TSH
Free Thyroxine Index: 2.5 (ref 1.2–4.9)
T3 Uptake Ratio: 32 % (ref 24–39)
T4, Total: 7.8 ug/dL (ref 4.5–12.0)
TSH: 2.73 u[IU]/mL (ref 0.450–4.500)

## 2019-03-29 LAB — LIPID PANEL
Chol/HDL Ratio: 3.1 ratio (ref 0.0–4.4)
Cholesterol, Total: 182 mg/dL (ref 100–199)
HDL: 58 mg/dL (ref >39)
LDL Chol Calc (NIH): 111 mg/dL — ABNORMAL HIGH (ref 0–99)
Triglycerides: 70 mg/dL (ref 0–149)
VLDL Cholesterol Cal: 13 mg/dL (ref 5–40)

## 2019-03-29 LAB — VITAMIN D 25 HYDROXY (VIT D DEFICIENCY, FRACTURES): Vit D, 25-Hydroxy: 56.2 ng/mL (ref 30.0–100.0)

## 2019-03-31 ENCOUNTER — Other Ambulatory Visit: Payer: Self-pay | Admitting: Family Medicine

## 2019-03-31 MED ORDER — LEVOTHYROXINE SODIUM 88 MCG PO TABS: 88.0000 ug | ORAL_TABLET | Freq: Every day | ORAL | 3 refills | Status: DC

## 2019-04-15 DIAGNOSIS — D259 Leiomyoma of uterus, unspecified: Secondary | ICD-10-CM | POA: Insufficient documentation

## 2019-04-15 DIAGNOSIS — R102 Pelvic and perineal pain: Secondary | ICD-10-CM | POA: Insufficient documentation

## 2019-04-15 DIAGNOSIS — I341 Nonrheumatic mitral (valve) prolapse: Secondary | ICD-10-CM | POA: Insufficient documentation

## 2019-04-15 DIAGNOSIS — K429 Umbilical hernia without obstruction or gangrene: Secondary | ICD-10-CM | POA: Insufficient documentation

## 2019-04-15 DIAGNOSIS — M8580 Other specified disorders of bone density and structure, unspecified site: Secondary | ICD-10-CM | POA: Insufficient documentation

## 2019-04-28 ENCOUNTER — Telehealth: Payer: Self-pay | Admitting: *Deleted

## 2019-04-28 NOTE — Telephone Encounter (Signed)
Patient called this morning and notified the scheduler Caron Presume that she is out of town and cannot make the [facility] appointment. She states she will call back to reschedule this. I called the patient at this time, no answer, left a message explaining to her that she needs to call us back today before 5 pm to get the PV rescheduled or the colonoscopy would be cancelled.

## 2019-05-16 ENCOUNTER — Encounter: Payer: Self-pay | Admitting: Gastroenterology

## 2019-08-30 ENCOUNTER — Other Ambulatory Visit: Payer: Self-pay | Admitting: Family Medicine

## 2019-08-31 ENCOUNTER — Ambulatory Visit: Payer: Self-pay | Attending: Internal Medicine

## 2019-08-31 DIAGNOSIS — Z23 Encounter for immunization: Secondary | ICD-10-CM | POA: Insufficient documentation

## 2019-08-31 NOTE — Progress Notes (Signed)
   Covid-19 Vaccination Clinic  Name:  Courtney Elliott    MRN: [id number] DOB: January 11, 1956  08/31/2019  [name] was observed post Covid-19 immunization for 15 minutes without incident. She was provided with Vaccine Information Sheet and instruction to access the V-Safe system.   [name] was instructed to call 911 with any severe reactions post vaccine: Marland Kitchen Difficulty breathing  . Swelling of face and throat  . A fast heartbeat  . A bad rash all over body  . Dizziness and weakness   Immunizations Administered    Name Date Dose VIS Date Route   Pfizer COVID-19 Vaccine 08/31/2019  2:27 PM 0.3 mL 06/06/2019 Intramuscular   Manufacturer: [place]   Lot: [id number]   [place]: [id number]

## 2019-09-01 ENCOUNTER — Other Ambulatory Visit: Payer: Self-pay | Admitting: *Deleted

## 2019-09-01 DIAGNOSIS — I10 Essential (primary) hypertension: Secondary | ICD-10-CM

## 2019-09-01 MED ORDER — METOPROLOL SUCCINATE ER 25 MG PO TB24: ORAL_TABLET | ORAL | 0 refills | Status: DC

## 2019-09-24 ENCOUNTER — Encounter: Payer: Self-pay | Admitting: Obstetrics and Gynecology

## 2019-09-30 ENCOUNTER — Ambulatory Visit: Payer: Self-pay | Attending: Internal Medicine

## 2019-09-30 DIAGNOSIS — Z23 Encounter for immunization: Secondary | ICD-10-CM

## 2019-09-30 NOTE — Progress Notes (Signed)
   Covid-19 Vaccination Clinic  Name:  Courtney Elliott    MRN: [id number] DOB: [birth year]  09/30/2019  [name] was observed post Covid-19 immunization for 15 minutes without incident. She was provided with Vaccine Information Sheet and instruction to access the V-Safe system.   [name] was instructed to call 911 with any severe reactions post vaccine: Marland Kitchen Difficulty breathing  . Swelling of face and throat  . A fast heartbeat  . A bad rash all over body  . Dizziness and weakness   Immunizations Administered    Name Date Dose VIS Date Route   Pfizer COVID-19 Vaccine 09/30/2019 12:28 PM 0.3 mL 06/06/2019 Intramuscular   Manufacturer: [place]   Lot: [id number]   [place]: [id number]

## 2019-11-07 ENCOUNTER — Other Ambulatory Visit: Payer: Self-pay | Admitting: Family Medicine

## 2019-11-07 DIAGNOSIS — I10 Essential (primary) hypertension: Secondary | ICD-10-CM

## 2019-11-07 NOTE — Telephone Encounter (Signed)
[employer]. NTBS for 6 mos fu mail order sent

## 2020-02-05 ENCOUNTER — Other Ambulatory Visit: Payer: Self-pay | Admitting: Family Medicine

## 2020-03-11 ENCOUNTER — Encounter: Payer: Self-pay | Admitting: Gastroenterology

## 2020-04-26 ENCOUNTER — Ambulatory Visit (AMBULATORY_SURGERY_CENTER): Payer: BC Managed Care – PPO | Admitting: *Deleted

## 2020-04-26 ENCOUNTER — Other Ambulatory Visit: Payer: Self-pay

## 2020-04-26 VITALS — Ht 66.0 in | Wt 107.0 lb

## 2020-04-26 DIAGNOSIS — Z1211 Encounter for screening for malignant neoplasm of colon: Secondary | ICD-10-CM

## 2020-04-26 NOTE — Progress Notes (Signed)
Patient's pre-visit was done today over the phone with the patient due to COVID-19 pandemic. Name,DOB and address verified. Insurance verified. Packet of Prep instructions mailed to patient including copy of a consent form and pre-procedure patient acknowledgement form-pt is aware.  Patient understands to call us back with any questions or concerns. COVID-19 booster completed 03/31/20 per pt. Pt is aware that care partner will wait in the car during procedure; if they feel like they will be too hot or cold to wait in the car; they may wait in the 4 th floor lobby. Patient is aware to bring only one care partner. We want them to wear a mask (we do not have any that we can provide them), practice social distancing, and we will check their temperatures when they get here.  I did remind the patient that their care partner needs to stay in the parking lot the entire time and have a cell phone available, we will call them when the pt is ready for discharge. Patient will wear mask into building.

## 2020-04-29 ENCOUNTER — Encounter: Payer: Self-pay | Admitting: Gastroenterology

## 2020-05-05 ENCOUNTER — Other Ambulatory Visit: Payer: Self-pay | Admitting: Family Medicine

## 2020-05-05 NOTE — Telephone Encounter (Signed)
[employer]. NTBS LOV & TSH 03/28/19

## 2020-05-10 ENCOUNTER — Encounter: Payer: Self-pay | Admitting: Gastroenterology

## 2020-05-10 ENCOUNTER — Ambulatory Visit (AMBULATORY_SURGERY_CENTER): Payer: BC Managed Care – PPO | Admitting: Gastroenterology

## 2020-05-10 ENCOUNTER — Other Ambulatory Visit: Payer: Self-pay

## 2020-05-10 VITALS — BP 156/87 | HR 61 | Temp 97.8°F | Resp 12 | Ht 66.0 in | Wt 107.0 lb

## 2020-05-10 DIAGNOSIS — K635 Polyp of colon: Secondary | ICD-10-CM | POA: Diagnosis not present

## 2020-05-10 DIAGNOSIS — Z1211 Encounter for screening for malignant neoplasm of colon: Secondary | ICD-10-CM

## 2020-05-10 DIAGNOSIS — D122 Benign neoplasm of ascending colon: Secondary | ICD-10-CM

## 2020-05-10 MED ORDER — SODIUM CHLORIDE 0.9 % IV SOLN: 500.0000 mL | Freq: Once | INTRAVENOUS | Status: DC

## 2020-05-10 NOTE — Progress Notes (Signed)
Called to room to assist during endoscopic procedure.  Patient ID and intended procedure confirmed with present staff. Received instructions for my participation in the procedure from the performing physician.  

## 2020-05-10 NOTE — Patient Instructions (Signed)
HANDOUTS PROVIDED ON: POLYPS  The polyp removed today have been sent for pathology.  The results can take 1-3 weeks to receive.  When your next colonoscopy should occur will be based on the pathology results.    You may resume your previous diet and medication schedule.  Thank you for allowing [place] to care for you today!!!   YOU HAD AN ENDOSCOPIC PROCEDURE TODAY AT [facility]:   Refer to the procedure report that was given to you for any specific questions about what was found during the examination.  If the procedure report does not answer your questions, please call your gastroenterologist to clarify.  If you requested that your care partner not be given the details of your procedure findings, then the procedure report has been included in a sealed envelope for you to review at your convenience later.  YOU SHOULD EXPECT: Some feelings of bloating in the abdomen. Passage of more gas than usual.  Walking can help get rid of the air that was put into your GI tract during the procedure and reduce the bloating. If you had a lower endoscopy (such as a colonoscopy or flexible sigmoidoscopy) you may notice spotting of blood in your stool or on the toilet paper. If you underwent a bowel prep for your procedure, you may not have a normal bowel movement for a few days.  Please Note:  You might notice some irritation and congestion in your nose or some drainage.  This is from the oxygen used during your procedure.  There is no need for concern and it should clear up in a day or so.  SYMPTOMS TO REPORT IMMEDIATELY:   Following lower endoscopy (colonoscopy or flexible sigmoidoscopy):  Excessive amounts of blood in the stool  Significant tenderness or worsening of abdominal pains  Swelling of the abdomen that is new, acute  Fever of 100F or higher  For urgent or emergent issues, a gastroenterologist can be reached at any hour by calling [phone]. Do not use MyChart messaging for  urgent concerns.    DIET:  We do recommend a small meal at first, but then you may proceed to your regular diet.  Drink plenty of fluids but you should avoid alcoholic beverages for 24 hours.  ACTIVITY:  You should plan to take it easy for the rest of today and you should NOT DRIVE or use heavy machinery until tomorrow (because of the sedation medicines used during the test).    FOLLOW UP: Our staff will call the number listed on your records Wednesday morning between 7:15 am and 8:15 am to check on you and address any questions or concerns that you may have regarding the information given to you following your procedure. If we do not reach you, we will leave a message.  We will attempt to reach you two times.  During this call, we will ask if you have developed any symptoms of COVID 19. If you develop any symptoms (ie: fever, flu-like symptoms, shortness of breath, cough etc.) before then, please call [phone].  If you test positive for Covid 19 in the 2 weeks post procedure, please call and report this information to [place].    If any biopsies were taken you will be contacted by phone or by letter within the next 1-3 weeks.  Please call us at [phone] if you have not heard about the biopsies in 3 weeks.    SIGNATURES/CONFIDENTIALITY: You and/or your care partner have signed paperwork which will  be entered into your electronic medical record.  These signatures attest to the fact that that the information above on your After Visit Summary has been reviewed and is understood.  Full responsibility of the confidentiality of this discharge information lies with you and/or your care-partner. 

## 2020-05-10 NOTE — Op Note (Signed)
[facility] Patient Name: Courtney Elliott Procedure Date: 05/10/2020 10:43 AM MRN: [id number] Endoscopist: Milus Banister , MD Age: 64 Referring MD:  Date of Birth: March 10, 1956 Gender: Female Account #: [id number] Procedure:                Colonoscopy Indications:              Screening for colorectal malignant neoplasm Medicines:                Monitored Anesthesia Care Procedure:                Pre-Anesthesia Assessment:                           - Prior to the procedure, a History and Physical                            was performed, and patient medications and                            allergies were reviewed. The patient's tolerance of                            previous anesthesia was also reviewed. The risks                            and benefits of the procedure and the sedation                            options and risks were discussed with the patient.                            All questions were answered, and informed consent                            was obtained. Prior Anticoagulants: The patient has                            taken no previous anticoagulant or antiplatelet                            agents. ASA Grade Assessment: II - A patient with                            mild systemic disease. After reviewing the risks                            and benefits, the patient was deemed in                            satisfactory condition to undergo the procedure.                           After obtaining informed consent, the colonoscope  was passed under direct vision. Throughout the                            procedure, the patient's blood pressure, pulse, and                            oxygen saturations were monitored continuously. The                            Colonoscope was introduced through the anus and                            advanced to the the cecum, identified by                            appendiceal orifice and  ileocecal valve. The                            colonoscopy was performed without difficulty. The                            patient tolerated the procedure well. The quality                            of the bowel preparation was good. The ileocecal                            valve, appendiceal orifice, and rectum were                            photographed. Scope In: 10:52:15 AM Scope Out: 11:04:40 AM Total Procedure Duration: 0 hours 12 minutes 25 seconds  Findings:                 A 4 mm polyp was found in the ascending colon. The                            polyp was sessile. The polyp was removed with a                            cold snare. Resection and retrieval were complete.                           The exam was otherwise without abnormality on                            direct and retroflexion views. Complications:            No immediate complications. Estimated blood loss:                            None. Estimated Blood Loss:     Estimated blood loss: none. Impression:               - One 4 mm polyp in the ascending colon,  removed                            with a cold snare. Resected and retrieved.                           - The examination was otherwise normal on direct                            and retroflexion views. Recommendation:           - Patient has a contact number available for                            emergencies. The signs and symptoms of potential                            delayed complications were discussed with the                            patient. Return to normal activities tomorrow.                            Written discharge instructions were provided to the                            patient.                           - Resume previous diet.                           - Continue present medications.                           - Await pathology results. Milus Banister, MD 05/10/2020 11:06:17 AM This report has been signed electronically.

## 2020-05-10 NOTE — Progress Notes (Signed)
To pacu, VSS. Report to [email or web address] 

## 2020-05-12 ENCOUNTER — Telehealth: Payer: Self-pay

## 2020-05-12 NOTE — Telephone Encounter (Signed)
°  Follow up Call-  Call back number 05/10/2020  Post procedure Call Back phone  # [phone]  Pt.'s husband Gene  Permission to leave phone message Yes  Some recent data might be hidden     Patient questions:  Do you have a fever, pain , or abdominal swelling? No. Pain Score  0 *  Have you tolerated food without any problems? Yes.    Have you been able to return to your normal activities? Yes.    Do you have any questions about your discharge instructions: Diet   No. Medications  No. Follow up visit  No.  Do you have questions or concerns about your Care? No.  Actions: * If pain score is 4 or above: 1. No action needed, pain <4.Have you developed a fever since your procedure? no  2.   Have you had an respiratory symptoms (SOB or cough) since your procedure? no  3.   Have you tested positive for COVID 19 since your procedure no  4.   Have you had any family members/close contacts diagnosed with the COVID 19 since your procedure?  no   If yes to any of these questions please route to Joylene John, RN and Joella Prince, RN

## 2020-05-17 ENCOUNTER — Encounter: Payer: Self-pay | Admitting: Gastroenterology

## 2020-06-08 ENCOUNTER — Other Ambulatory Visit: Payer: Self-pay

## 2020-06-08 DIAGNOSIS — I10 Essential (primary) hypertension: Secondary | ICD-10-CM

## 2020-06-08 MED ORDER — METOPROLOL SUCCINATE ER 25 MG PO TB24: 25.0000 mg | ORAL_TABLET | Freq: Every day | ORAL | 0 refills | Status: DC

## 2020-06-09 ENCOUNTER — Other Ambulatory Visit: Payer: Self-pay | Admitting: Family Medicine

## 2020-06-09 DIAGNOSIS — I10 Essential (primary) hypertension: Secondary | ICD-10-CM

## 2020-07-09 ENCOUNTER — Telehealth: Payer: Self-pay

## 2020-07-09 MED ORDER — LEVOTHYROXINE SODIUM 88 MCG PO TABS: 88.0000 ug | ORAL_TABLET | Freq: Every day | ORAL | 0 refills | Status: DC

## 2020-07-09 NOTE — Telephone Encounter (Signed)
Pt currently has Covid while out of state, is out of her thyroid medication. Was last seen 03/28/19, appt made today for 08/03/18, needs 30 day supply sent to local [facility]

## 2020-07-09 NOTE — Telephone Encounter (Signed)
Pt aware 30d sent to [place] for her to try to get it transferred to where she is at

## 2020-07-09 NOTE — Telephone Encounter (Signed)
Ok to give #30. NO FURTHER REFILLS until seen.

## 2020-07-09 NOTE — Telephone Encounter (Signed)
  Prescription Request  07/09/2020  What is the name of the medication or equipment? Thyroid rx./ pt has covid and is out of town. She is about out of meds  Have you contacted your pharmacy to request a refill? (if applicable) yes  Which pharmacy would you like this sent to? [facility]    Patient notified that their request is being sent to the clinical staff for review and that they should receive a response within 2 business days.

## 2020-07-30 ENCOUNTER — Telehealth: Payer: Self-pay

## 2020-07-30 NOTE — Telephone Encounter (Signed)
Appt made

## 2020-08-01 ENCOUNTER — Other Ambulatory Visit: Payer: Self-pay | Admitting: Family Medicine

## 2020-08-02 NOTE — Telephone Encounter (Signed)
Ok to fill rx this LAST TIME.  Patient has canceled her appointment several times now.  I will refill this no more until she is seen

## 2020-08-03 ENCOUNTER — Ambulatory Visit: Payer: BC Managed Care – PPO | Admitting: Family Medicine

## 2020-08-30 ENCOUNTER — Ambulatory Visit: Payer: BC Managed Care – PPO | Admitting: Family Medicine

## 2020-09-24 ENCOUNTER — Other Ambulatory Visit: Payer: Self-pay

## 2020-09-24 ENCOUNTER — Encounter: Payer: Self-pay | Admitting: Nurse Practitioner

## 2020-09-24 ENCOUNTER — Ambulatory Visit: Payer: BC Managed Care – PPO | Admitting: Nurse Practitioner

## 2020-09-24 VITALS — BP 138/83 | HR 68 | Temp 96.9°F | Ht 66.0 in | Wt 112.0 lb

## 2020-09-24 DIAGNOSIS — E039 Hypothyroidism, unspecified: Secondary | ICD-10-CM | POA: Diagnosis not present

## 2020-09-24 DIAGNOSIS — Z8679 Personal history of other diseases of the circulatory system: Secondary | ICD-10-CM

## 2020-09-24 DIAGNOSIS — I10 Essential (primary) hypertension: Secondary | ICD-10-CM | POA: Diagnosis not present

## 2020-09-24 MED ORDER — LEVOTHYROXINE SODIUM 88 MCG PO TABS
88.0000 ug | ORAL_TABLET | Freq: Every day | ORAL | 1 refills | Status: DC
Start: 2020-09-24 — End: 2020-12-06

## 2020-09-24 MED ORDER — METOPROLOL SUCCINATE ER 25 MG PO TB24: 25.0000 mg | ORAL_TABLET | Freq: Every day | ORAL | 1 refills | Status: DC

## 2020-09-24 NOTE — Progress Notes (Signed)
Established Patient Office Visit  Subjective:  Patient ID: Courtney Elliott, female    DOB: 02/01/1956  Age: 65 y.o. MRN: [id number]  CC:  Chief Complaint  Patient presents with  . Medical Management of Chronic Issues    HPI Courtney Elliott presents for follow-up hyperlipidemia:.  Her last labs showed Total cholesterol of HDL 3.1, LDL 111 triglycerides 70 negative. There is not a family history of hyperlipidemia. There is a family history of early ischemia heart disease.   Hypertension: Patient is here for evaluation of elevated blood pressures.  Age at onset of elevated blood pressure: 56 years.Cardiac symptoms none. Patient denies chest pressure/discomfort, irregular heart beat, lower extremity edema and near-syncope.  Cardiovascular risk factors: hypertension. Use of agents associated with hypertension: none. History of target organ damage: none.  Thyroid: Patient presents for evaluation of follow-up hypothyroidism. Current symptoms include fatigue. Patient denies denies fatigue, weight changes, heat/cold intolerance, bowel/skin changes or CVS symptoms, fatigue.     Past Medical History:  Diagnosis Date  . Blood transfusion without reported diagnosis 1969  . Heart murmur   . Hypertension   . Kidney disease    Pt has 1 kidney  . Mitral valve prolapse   . Thyroid disease    hypothyroid    Past Surgical History:  Procedure Laterality Date  . ABLATION    . BUNIONECTOMY Bilateral   . COLONOSCOPY  07/03/2007   Dr.Jacobs    Family History  Problem Relation Age of Onset  . Diabetes Mother   . Heart disease Mother   . Alcohol abuse Father   . Diabetes Sister   . Heart disease Brother 84  . Hypertension Brother   . Alzheimer's disease Maternal Aunt   . Colon cancer Neg Hx   . Colon polyps Neg Hx   . Esophageal cancer Neg Hx   . Stomach cancer Neg Hx   . Rectal cancer Neg Hx     Social History   Socioeconomic History  . Marital status: Married    Spouse name: Not  on file  . Number of children: Not on file  . Years of education: Not on file  . Highest education level: Not on file  Occupational History  . Not on file  Tobacco Use  . Smoking status: Never Smoker  . Smokeless tobacco: Never Used  Vaping Use  . Vaping Use: Never used  Substance and Sexual Activity  . Alcohol use: Yes    Comment: occasional   . Drug use: No  . Sexual activity: Not on file    Comment: husband vasectomy   Other Topics Concern  . Not on file  Social History Narrative  . Not on file   Social Determinants of Health   Financial Resource Strain: Not on file  Food Insecurity: Not on file  Transportation Needs: Not on file  Physical Activity: Not on file  Stress: Not on file  Social Connections: Not on file  Intimate Partner Violence: Not on file    Outpatient Medications Prior to Visit  Medication Sig Dispense Refill  . Aluminum & Magnesium Hydroxide (MAGNESIUM-ALUMINUM PO) Take by mouth.    . fish oil-omega-3 fatty acids 1000 MG capsule Take 2 g by mouth daily.    . Multiple Vitamin (MULTIVITAMIN) capsule Take 1 capsule by mouth daily.    Marland Kitchen VITAMIN D, ERGOCALCIFEROL, PO Take by mouth.    . levothyroxine (SYNTHROID) 88 MCG tablet Take 1 tablet (88 mcg total) by mouth daily. NO FURTHER FILLS UNTIL  SEEN 30 tablet 0  . metoprolol succinate (TOPROL-XL) 25 MG 24 hr tablet Take 1 tablet (25 mg total) by mouth daily. 90 tablet 0   No facility-administered medications prior to visit.    No Known Allergies  ROS Review of Systems  Constitutional: Negative.   HENT: Negative.   Respiratory: Negative.   Cardiovascular: Negative.   Gastrointestinal: Negative.   Genitourinary: Negative.   Musculoskeletal: Negative.   Skin: Negative.   Neurological: Negative.   Hematological: Negative.   All other systems reviewed and are negative.     Objective:    Physical Exam Vitals reviewed.  Constitutional:      Appearance: Normal appearance.  HENT:     Head:  Normocephalic.     Nose: Nose normal.  Eyes:     Conjunctiva/sclera: Conjunctivae normal.  Cardiovascular:     Rate and Rhythm: Normal rate and regular rhythm.     Pulses: Normal pulses.     Heart sounds: Normal heart sounds.  Pulmonary:     Effort: Pulmonary effort is normal.     Breath sounds: Normal breath sounds.  Abdominal:     General: Bowel sounds are normal.  Musculoskeletal:        General: Normal range of motion.  Skin:    General: Skin is warm.  Neurological:     Mental Status: She is alert and oriented to person, place, and time.  Psychiatric:        Behavior: Behavior normal.     BP 138/83   Pulse 68   Temp (!) 96.9 F (36.1 C) (Temporal)   Ht 5\' 6"  (1.676 m)   Wt 112 lb (50.8 kg)   SpO2 100%   BMI 18.08 kg/m  Wt Readings from Last 3 Encounters:  09/24/20 112 lb (50.8 kg)  05/10/20 107 lb (48.5 kg)  04/26/20 107 lb (48.5 kg)     Health Maintenance Due  Topic Date Due  . PAP SMEAR-Modifier  06/26/2018  . MAMMOGRAM  07/27/2018  . COVID-19 Vaccine (3 - Booster for Pfizer series) 03/31/2020    There are no preventive care reminders to display for this patient.  Lab Results  Component Value Date   TSH 2.730 03/28/2019   Lab Results  Component Value Date   WBC 3.4 03/28/2019   HGB 11.7 03/28/2019   HCT 38.6 03/28/2019   MCV 84 03/28/2019   PLT 180 03/28/2019   Lab Results  Component Value Date   NA 140 03/28/2019   K 4.5 03/28/2019   CO2 25 03/28/2019   GLUCOSE 91 03/28/2019   BUN 9 03/28/2019   CREATININE 0.97 03/28/2019   BILITOT 0.4 03/28/2019   ALKPHOS 90 03/28/2019   AST 24 03/28/2019   ALT 15 03/28/2019   PROT 7.3 03/28/2019   ALBUMIN 4.2 03/28/2019   CALCIUM 9.4 03/28/2019   Lab Results  Component Value Date   CHOL 182 03/28/2019   Lab Results  Component Value Date   HDL 58 03/28/2019   Lab Results  Component Value Date   LDLCALC 111 (H) 03/28/2019   Lab Results  Component Value Date   TRIG 70 03/28/2019   Lab  Results  Component Value Date   CHOLHDL 3.1 03/28/2019   No results found for: HGBA1C    Assessment & Plan:   Problem List Items Addressed This Visit      Cardiovascular and Mediastinum   Essential hypertension    Hypertension well controlled on metoprolol 25 mg tablet by mouth daily.  Education provided to patient continue low-sodium diet and exercise as tolerated. Follow-up in 3 months labs completed CBC, CMP.  Results pending.      Relevant Medications   metoprolol succinate (TOPROL-XL) 25 MG 24 hr tablet     Endocrine   Hypothyroidism - Primary    No signs or symptoms of hypothyroidism Completed lipid panel results pending. Currently on levothyroxine 88 MCG 1 tablet by mouth daily.  Follow-up in 3 months, education provided to patient with printed handouts given. Rx sent to pharmacy.      Relevant Medications   metoprolol succinate (TOPROL-XL) 25 MG 24 hr tablet   levothyroxine (SYNTHROID) 88 MCG tablet   Other Relevant Orders   TSH     Other   History of hypertension    .         No orders of the defined types were placed in this encounter.   Follow-up: Return in about 3 months (around 12/24/2020).    Ivy Lynn, NP

## 2020-09-24 NOTE — Assessment & Plan Note (Signed)
No signs or symptoms of hypothyroidism Completed lipid panel results pending. Currently on levothyroxine 88 MCG 1 tablet by mouth daily.  Follow-up in 3 months, education provided to patient with printed handouts given. Rx sent to pharmacy.

## 2020-09-24 NOTE — Patient Instructions (Signed)
Hypothyroidism  Hypothyroidism is when the thyroid gland does not make enough of certain hormones (it is underactive). The thyroid gland is a small gland located in the lower front part of the neck, just in front of the windpipe (trachea). This gland makes hormones that help control how the body uses food for energy (metabolism) as well as how the heart and brain function. These hormones also play a role in keeping your bones strong. When the thyroid is underactive, it produces too little of the hormones thyroxine (T4) and triiodothyronine (T3). What are the causes? This condition may be caused by: Hashimoto's disease. This is a disease in which the body's disease-fighting system (immune system) attacks the thyroid gland. This is the most common cause. Viral infections. Pregnancy. Certain medicines. Birth defects. Past radiation treatments to the head or neck for cancer. Past treatment with radioactive iodine. Past exposure to radiation in the environment. Past surgical removal of part or all of the thyroid. Problems with a gland in the center of the brain (pituitary gland). Lack of enough iodine in the diet. What increases the risk? You are more likely to develop this condition if: You are female. You have a family history of thyroid conditions. You use a medicine called lithium. You take medicines that affect the immune system (immunosuppressants). What are the signs or symptoms? Symptoms of this condition include: Feeling as though you have no energy (lethargy). Not being able to tolerate cold. Weight gain that is not explained by a change in diet or exercise habits. Lack of appetite. Dry skin. Coarse hair. Menstrual irregularity. Slowing of thought processes. Constipation. Sadness or depression. How is this diagnosed? This condition may be diagnosed based on: Your symptoms, your medical history, and a physical exam. Blood tests. You may also have imaging tests, such as an  ultrasound or MRI. How is this treated? This condition is treated with medicine that replaces the thyroid hormones that your body does not make. After you begin treatment, it may take several weeks for symptoms to go away. Follow these instructions at home: Take over-the-counter and prescription medicines only as told by your health care provider. If you start taking any new medicines, tell your health care provider. Keep all follow-up visits as told by your health care provider. This is important. As your condition improves, your dosage of thyroid hormone medicine may change. You will need to have blood tests regularly so that your health care provider can monitor your condition. Contact a health care provider if: Your symptoms do not get better with treatment. You are taking thyroid hormone replacement medicine and you: Sweat a lot. Have tremors. Feel anxious. Lose weight rapidly. Cannot tolerate heat. Have emotional swings. Have diarrhea. Feel weak. Get help right away if you have: Chest pain. An irregular heartbeat. A rapid heartbeat. Difficulty breathing. Summary Hypothyroidism is when the thyroid gland does not make enough of certain hormones (it is underactive). When the thyroid is underactive, it produces too little of the hormones thyroxine (T4) and triiodothyronine (T3). The most common cause is Hashimoto's disease, a disease in which the body's disease-fighting system (immune system) attacks the thyroid gland. The condition can also be caused by viral infections, medicine, pregnancy, or past radiation treatment to the head or neck. Symptoms may include weight gain, dry skin, constipation, feeling as though you do not have energy, and not being able to tolerate cold. This condition is treated with medicine to replace the thyroid hormones that your body does not make. This  information is not intended to replace advice given to you by your health care provider. Make sure you  discuss any questions you have with your health care provider. Document Revised: 03/12/2020 Document Reviewed: 02/26/2020 Elsevier Patient Education  2021 [place]. High Cholesterol  High cholesterol is a condition in which the blood has high levels of a white, waxy substance similar to fat (cholesterol). The liver makes all the cholesterol that the body needs. The human body needs small amounts of cholesterol to help build cells. A person gets extra or excess cholesterol from the food that he or she eats. The blood carries cholesterol from the liver to the rest of the body. If you have high cholesterol, deposits (plaques) may build up on the walls of your arteries. Arteries are the blood vessels that carry blood away from your heart. These plaques make the arteries narrow and stiff. Cholesterol plaques increase your risk for heart attack and stroke. Work with your health care provider to keep your cholesterol levels in a healthy range. What increases the risk? The following factors may make you more likely to develop this condition:  Eating foods that are high in animal fat (saturated fat) or cholesterol.  Being overweight.  Not getting enough exercise.  A family history of high cholesterol (familial hypercholesterolemia).  Use of tobacco products.  Having diabetes. What are the signs or symptoms? There are no symptoms of this condition. How is this diagnosed? This condition may be diagnosed based on the results of a blood test.  If you are older than 64 years of age, your health care provider may check your cholesterol levels every 4-6 years.  You may be checked more often if you have high cholesterol or other risk factors for heart disease. The blood test for cholesterol measures:  "Bad" cholesterol, or LDL cholesterol. This is the main type of cholesterol that causes heart disease. The desired level is less than 100 mg/dL.  "Good" cholesterol, or HDL cholesterol. HDL helps  protect against heart disease by cleaning the arteries and carrying the LDL to the liver for processing. The desired level for HDL is 60 mg/dL or higher.  Triglycerides. These are fats that your body can store or burn for energy. The desired level is less than 150 mg/dL.  Total cholesterol. This measures the total amount of cholesterol in your blood and includes LDL, HDL, and triglycerides. The desired level is less than 200 mg/dL. How is this treated? This condition may be treated with:  Diet changes. You may be asked to eat foods that have more fiber and less saturated fats or added sugar.  Lifestyle changes. These may include regular exercise, maintaining a healthy weight, and quitting use of tobacco products.  Medicines. These are given when diet and lifestyle changes have not worked. You may be prescribed a statin medicine to help lower your cholesterol levels. Follow these instructions at home: Eating and drinking  Eat a healthy, balanced diet. This diet includes: ? Daily servings of a variety of fresh, frozen, or canned fruits and vegetables. ? Daily servings of whole grain foods that are rich in fiber. ? Foods that are low in saturated fats and trans fats. These include poultry and fish without skin, lean cuts of meat, and low-fat dairy products. ? A variety of fish, especially oily fish that contain omega-3 fatty acids. Aim to eat fish at least 2 times a week.  Avoid foods and drinks that have added sugar.  Use healthy cooking methods, such  as roasting, grilling, broiling, baking, poaching, steaming, and stir-frying. Do not fry your food except for stir-frying.   Lifestyle  Get regular exercise. Aim to exercise for a total of 150 minutes a week. Increase your activity level by doing activities such as gardening, walking, and taking the stairs.  Do not use any products that contain nicotine or tobacco, such as cigarettes, e-cigarettes, and chewing tobacco. If you need help  quitting, ask your health care provider.   General instructions  Take over-the-counter and prescription medicines only as told by your health care provider.  Keep all follow-up visits as told by your health care provider. This is important. Where to find more information  American Heart Association: www.heart.org  National Heart, Lung, and Blood Institute: https://wilson-eaton.com/ Contact a health care provider if:  You have trouble achieving or maintaining a healthy diet or weight.  You are starting an exercise program.  You are unable to stop smoking. Get help right away if:  You have chest pain.  You have trouble breathing.  You have any symptoms of a stroke. "BE FAST" is an easy way to remember the main warning signs of a stroke: ? B - Balance. Signs are dizziness, sudden trouble walking, or loss of balance. ? E - Eyes. Signs are trouble seeing or a sudden change in vision. ? F - Face. Signs are sudden weakness or numbness of the face, or the face or eyelid drooping on one side. ? A - Arms. Signs are weakness or numbness in an arm. This happens suddenly and usually on one side of the body. ? S - Speech. Signs are sudden trouble speaking, slurred speech, or trouble understanding what people say. ? T - Time. Time to call emergency services. Write down what time symptoms started.  You have other signs of a stroke, such as: ? A sudden, severe headache with no known cause. ? Nausea or vomiting. ? Seizure. These symptoms may represent a serious problem that is an emergency. Do not wait to see if the symptoms will go away. Get medical help right away. Call your local emergency services (911 in the [place] Do not drive yourself to the hospital. Summary  Cholesterol plaques increase your risk for heart attack and stroke. Work with your health care provider to keep your cholesterol levels in a healthy range.  Eat a healthy, balanced diet, get regular exercise, and maintain a healthy  weight.  Do not use any products that contain nicotine or tobacco, such as cigarettes, e-cigarettes, and chewing tobacco.  Get help right away if you have any symptoms of a stroke. This information is not intended to replace advice given to you by your health care provider. Make sure you discuss any questions you have with your health care provider. Document Revised: 05/12/2019 Document Reviewed: 05/12/2019 Elsevier Patient Education  2021 [place]. Hypertension, Adult Hypertension is another name for high blood pressure. High blood pressure forces your heart to work harder to pump blood. This can cause problems over time. There are two numbers in a blood pressure reading. There is a top number (systolic) over a bottom number (diastolic). It is best to have a blood pressure that is below 120/80. Healthy choices can help lower your blood pressure, or you may need medicine to help lower it. What are the causes? The cause of this condition is not known. Some conditions may be related to high blood pressure. What increases the risk?  Smoking.  Having type 2 diabetes mellitus, high cholesterol,  or both.  Not getting enough exercise or physical activity.  Being overweight.  Having too much fat, sugar, calories, or salt (sodium) in your diet.  Drinking too much alcohol.  Having long-term (chronic) kidney disease.  Having a family history of high blood pressure.  Age. Risk increases with age.  Race. You may be at higher risk if you are African American.  Gender. Men are at higher risk than women before age 67. After age 25, women are at higher risk than men.  Having obstructive sleep apnea.  Stress. What are the signs or symptoms?  High blood pressure may not cause symptoms. Very high blood pressure (hypertensive crisis) may cause: ? Headache. ? Feelings of worry or nervousness (anxiety). ? Shortness of breath. ? Nosebleed. ? A feeling of being sick to your stomach  (nausea). ? Throwing up (vomiting). ? Changes in how you see. ? Very bad chest pain. ? Seizures. How is this treated?  This condition is treated by making healthy lifestyle changes, such as: ? Eating healthy foods. ? Exercising more. ? Drinking less alcohol.  Your health care provider may prescribe medicine if lifestyle changes are not enough to get your blood pressure under control, and if: ? Your top number is above 130. ? Your bottom number is above 80.  Your personal target blood pressure may vary. Follow these instructions at home: Eating and drinking  If told, follow the DASH eating plan. To follow this plan: ? Fill one half of your plate at each meal with fruits and vegetables. ? Fill one fourth of your plate at each meal with whole grains. Whole grains include whole-wheat pasta, brown rice, and whole-grain bread. ? Eat or drink low-fat dairy products, such as skim milk or low-fat yogurt. ? Fill one fourth of your plate at each meal with low-fat (lean) proteins. Low-fat proteins include fish, chicken without skin, eggs, beans, and tofu. ? Avoid fatty meat, cured and processed meat, or chicken with skin. ? Avoid pre-made or processed food.  Eat less than 1,500 mg of salt each day.  Do not drink alcohol if: ? Your doctor tells you not to drink. ? You are pregnant, may be pregnant, or are planning to become pregnant.  If you drink alcohol: ? Limit how much you use to:  0-1 drink a day for women.  0-2 drinks a day for men. ? Be aware of how much alcohol is in your drink. In the [place] one drink equals one 12 oz bottle of beer (355 mL), one 5 oz glass of wine (148 mL), or one 1 oz glass of hard liquor (44 mL).   Lifestyle  Work with your doctor to stay at a healthy weight or to lose weight. Ask your doctor what the best weight is for you.  Get at least 30 minutes of exercise most days of the week. This may include walking, swimming, or biking.  Get at least 30 minutes  of exercise that strengthens your muscles (resistance exercise) at least 3 days a week. This may include lifting weights or doing Pilates.  Do not use any products that contain nicotine or tobacco, such as cigarettes, e-cigarettes, and chewing tobacco. If you need help quitting, ask your doctor.  Check your blood pressure at home as told by your doctor.  Keep all follow-up visits as told by your doctor. This is important.   Medicines  Take over-the-counter and prescription medicines only as told by your doctor. Follow directions carefully.  Do not  skip doses of blood pressure medicine. The medicine does not work as well if you skip doses. Skipping doses also puts you at risk for problems.  Ask your doctor about side effects or reactions to medicines that you should watch for. Contact a doctor if you:  Think you are having a reaction to the medicine you are taking.  Have headaches that keep coming back (recurring).  Feel dizzy.  Have swelling in your ankles.  Have trouble with your vision. Get help right away if you:  Get a very bad headache.  Start to feel mixed up (confused).  Feel weak or numb.  Feel faint.  Have very bad pain in your: ? Chest. ? Belly (abdomen).  Throw up more than once.  Have trouble breathing. Summary  Hypertension is another name for high blood pressure.  High blood pressure forces your heart to work harder to pump blood.  For most people, a normal blood pressure is less than 120/80.  Making healthy choices can help lower blood pressure. If your blood pressure does not get lower with healthy choices, you may need to take medicine. This information is not intended to replace advice given to you by your health care provider. Make sure you discuss any questions you have with your health care provider. Document Revised: 02/20/2018 Document Reviewed: 02/20/2018 Elsevier Patient Education  2021 [employer].

## 2020-09-24 NOTE — Assessment & Plan Note (Signed)
Hypertension well controlled on metoprolol 25 mg tablet by mouth daily. Education provided to patient continue low-sodium diet and exercise as tolerated. Follow-up in 3 months labs completed CBC, CMP.  Results pending.

## 2020-09-25 LAB — COMPREHENSIVE METABOLIC PANEL
ALT: 15 IU/L (ref 0–32)
AST: 25 IU/L (ref 0–40)
Albumin/Globulin Ratio: 1.3 (ref 1.2–2.2)
Albumin: 4.4 g/dL (ref 3.8–4.8)
Alkaline Phosphatase: 101 IU/L (ref 44–121)
BUN/Creatinine Ratio: 18 (ref 12–28)
BUN: 15 mg/dL (ref 8–27)
Bilirubin Total: 0.4 mg/dL (ref 0.0–1.2)
CO2: 22 mmol/L (ref 20–29)
Calcium: 9.4 mg/dL (ref 8.7–10.3)
Chloride: 102 mmol/L (ref 96–106)
Creatinine, Ser: 0.85 mg/dL (ref 0.57–1.00)
Globulin, Total: 3.5 g/dL (ref 1.5–4.5)
Glucose: 87 mg/dL (ref 65–99)
Potassium: 4.9 mmol/L (ref 3.5–5.2)
Sodium: 140 mmol/L (ref 134–144)
Total Protein: 7.9 g/dL (ref 6.0–8.5)
eGFR: 77 mL/min/{1.73_m2} (ref >59)

## 2020-09-25 LAB — CBC WITH DIFFERENTIAL/PLATELET
Basophils Absolute: 0.1 10*3/uL (ref 0.0–0.2)
Basos: 1 %
EOS (ABSOLUTE): 0.2 10*3/uL (ref 0.0–0.4)
Eos: 4 %
Hematocrit: 41.2 % (ref 34.0–46.6)
Hemoglobin: 12.5 g/dL (ref 11.1–15.9)
Immature Grans (Abs): 0 10*3/uL (ref 0.0–0.1)
Immature Granulocytes: 0 %
Lymphocytes Absolute: 2.5 10*3/uL (ref 0.7–3.1)
Lymphs: 54 %
MCH: 25.3 pg — ABNORMAL LOW (ref 26.6–33.0)
MCHC: 30.3 g/dL — ABNORMAL LOW (ref 31.5–35.7)
MCV: 83 fL (ref 79–97)
Monocytes Absolute: 0.5 10*3/uL (ref 0.1–0.9)
Monocytes: 11 %
Neutrophils Absolute: 1.4 10*3/uL (ref 1.4–7.0)
Neutrophils: 30 %
Platelets: 183 10*3/uL (ref 150–450)
RBC: 4.95 x10E6/uL (ref 3.77–5.28)
RDW: 13.1 % (ref 11.7–15.4)
WBC: 4.6 10*3/uL (ref 3.4–10.8)

## 2020-09-25 LAB — LIPID PANEL
Chol/HDL Ratio: 3.3 ratio (ref 0.0–4.4)
Cholesterol, Total: 199 mg/dL (ref 100–199)
HDL: 60 mg/dL (ref >39)
LDL Chol Calc (NIH): 121 mg/dL — ABNORMAL HIGH (ref 0–99)
Triglycerides: 100 mg/dL (ref 0–149)
VLDL Cholesterol Cal: 18 mg/dL (ref 5–40)

## 2020-09-25 LAB — TSH: TSH: 0.986 u[IU]/mL (ref 0.450–4.500)

## 2020-12-06 ENCOUNTER — Other Ambulatory Visit: Payer: Self-pay

## 2020-12-06 DIAGNOSIS — I10 Essential (primary) hypertension: Secondary | ICD-10-CM

## 2020-12-06 MED ORDER — METOPROLOL SUCCINATE ER 25 MG PO TB24: 25.0000 mg | ORAL_TABLET | Freq: Every day | ORAL | 0 refills | Status: DC

## 2020-12-06 MED ORDER — LEVOTHYROXINE SODIUM 88 MCG PO TABS: 88.0000 ug | ORAL_TABLET | Freq: Every day | ORAL | 1 refills | Status: DC

## 2021-02-02 ENCOUNTER — Other Ambulatory Visit: Payer: Self-pay | Admitting: Obstetrics and Gynecology

## 2021-02-02 DIAGNOSIS — E041 Nontoxic single thyroid nodule: Secondary | ICD-10-CM

## 2021-02-03 ENCOUNTER — Ambulatory Visit
Admission: RE | Admit: 2021-02-03 | Discharge: 2021-02-03 | Disposition: A | Discharge status: Home / Self Care | Payer: BC Managed Care – PPO | Source: Ambulatory Visit | Attending: Obstetrics and Gynecology | Admitting: Obstetrics and Gynecology

## 2021-02-03 DIAGNOSIS — E041 Nontoxic single thyroid nodule: Secondary | ICD-10-CM

## 2021-02-22 ENCOUNTER — Other Ambulatory Visit: Payer: Self-pay | Admitting: Endocrinology

## 2021-02-22 DIAGNOSIS — E041 Nontoxic single thyroid nodule: Secondary | ICD-10-CM

## 2021-03-16 ENCOUNTER — Other Ambulatory Visit (HOSPITAL_COMMUNITY)
Admission: RE | Admit: 2021-03-16 | Discharge: 2021-03-16 | Disposition: A | Discharge status: Home / Self Care | Payer: BC Managed Care – PPO | Source: Ambulatory Visit | Attending: Endocrinology | Admitting: Endocrinology

## 2021-03-16 ENCOUNTER — Ambulatory Visit
Admission: RE | Admit: 2021-03-16 | Discharge: 2021-03-16 | Disposition: A | Discharge status: Home / Self Care | Payer: BC Managed Care – PPO | Source: Ambulatory Visit | Attending: Endocrinology | Admitting: Endocrinology

## 2021-03-16 DIAGNOSIS — D440 Neoplasm of uncertain behavior of thyroid gland: Secondary | ICD-10-CM | POA: Insufficient documentation

## 2021-03-16 DIAGNOSIS — E041 Nontoxic single thyroid nodule: Secondary | ICD-10-CM

## 2021-03-17 LAB — CYTOLOGY - NON PAP

## 2021-04-20 ENCOUNTER — Encounter (HOSPITAL_COMMUNITY): Payer: Self-pay

## 2021-05-04 ENCOUNTER — Other Ambulatory Visit: Payer: Self-pay | Admitting: Nurse Practitioner

## 2021-05-04 DIAGNOSIS — I10 Essential (primary) hypertension: Secondary | ICD-10-CM

## 2021-05-05 ENCOUNTER — Other Ambulatory Visit: Payer: Self-pay | Admitting: Otolaryngology

## 2021-05-17 ENCOUNTER — Other Ambulatory Visit: Payer: Self-pay

## 2021-05-17 ENCOUNTER — Encounter (HOSPITAL_BASED_OUTPATIENT_CLINIC_OR_DEPARTMENT_OTHER): Payer: Self-pay | Admitting: Otolaryngology

## 2021-05-26 ENCOUNTER — Encounter (HOSPITAL_BASED_OUTPATIENT_CLINIC_OR_DEPARTMENT_OTHER)
Admission: RE | Admit: 2021-05-26 | Discharge: 2021-05-26 | Disposition: A | Discharge status: Home / Self Care | Payer: BC Managed Care – PPO | Source: Ambulatory Visit | Attending: Otolaryngology | Admitting: Otolaryngology

## 2021-05-26 ENCOUNTER — Other Ambulatory Visit: Payer: Self-pay

## 2021-05-26 DIAGNOSIS — E039 Hypothyroidism, unspecified: Secondary | ICD-10-CM | POA: Diagnosis not present

## 2021-05-26 DIAGNOSIS — Z01810 Encounter for preprocedural cardiovascular examination: Secondary | ICD-10-CM | POA: Diagnosis not present

## 2021-05-26 DIAGNOSIS — C73 Malignant neoplasm of thyroid gland: Secondary | ICD-10-CM | POA: Diagnosis not present

## 2021-05-26 DIAGNOSIS — E890 Postprocedural hypothyroidism: Secondary | ICD-10-CM | POA: Diagnosis present

## 2021-05-27 ENCOUNTER — Other Ambulatory Visit: Payer: Self-pay

## 2021-05-27 ENCOUNTER — Ambulatory Visit (HOSPITAL_BASED_OUTPATIENT_CLINIC_OR_DEPARTMENT_OTHER)
Admission: RE | Admit: 2021-05-27 | Discharge: 2021-05-28 | Disposition: A | Discharge status: Home / Self Care | Payer: BC Managed Care – PPO | Source: Ambulatory Visit | Attending: Otolaryngology | Admitting: Otolaryngology

## 2021-05-27 ENCOUNTER — Ambulatory Visit (HOSPITAL_BASED_OUTPATIENT_CLINIC_OR_DEPARTMENT_OTHER): Payer: BC Managed Care – PPO | Admitting: Certified Registered"

## 2021-05-27 ENCOUNTER — Encounter (HOSPITAL_BASED_OUTPATIENT_CLINIC_OR_DEPARTMENT_OTHER): Payer: Self-pay | Admitting: Otolaryngology

## 2021-05-27 ENCOUNTER — Encounter (HOSPITAL_BASED_OUTPATIENT_CLINIC_OR_DEPARTMENT_OTHER)
Admission: RE | Disposition: A | Discharge status: Home / Self Care | Payer: Self-pay | Source: Ambulatory Visit | Attending: Otolaryngology

## 2021-05-27 DIAGNOSIS — C73 Malignant neoplasm of thyroid gland: Secondary | ICD-10-CM | POA: Insufficient documentation

## 2021-05-27 DIAGNOSIS — E890 Postprocedural hypothyroidism: Secondary | ICD-10-CM

## 2021-05-27 DIAGNOSIS — E039 Hypothyroidism, unspecified: Secondary | ICD-10-CM | POA: Insufficient documentation

## 2021-05-27 HISTORY — PX: THYROIDECTOMY: SHX17

## 2021-05-27 HISTORY — DX: Hypothyroidism, unspecified: E03.9

## 2021-05-27 SURGERY — THYROIDECTOMY
Anesthesia: General | Site: Neck | Laterality: Right

## 2021-05-27 MED ORDER — OXYCODONE HCL 5 MG/5ML PO SOLN: 5.0000 mg | Freq: Once | ORAL | Status: DC | PRN

## 2021-05-27 MED ORDER — LIDOCAINE HCL (CARDIAC) PF 100 MG/5ML IV SOSY
PREFILLED_SYRINGE | INTRAVENOUS | Status: DC | PRN
  Administered 2021-05-27: 100 mg via INTRAVENOUS

## 2021-05-27 MED ORDER — HYDROMORPHONE HCL 1 MG/ML IJ SOLN
INTRAMUSCULAR | Status: AC
  Filled 2021-05-27: qty 0.5

## 2021-05-27 MED ORDER — OXYCODONE-ACETAMINOPHEN 5-325 MG PO TABS: 1.0000 | ORAL_TABLET | ORAL | 0 refills | Status: AC | PRN

## 2021-05-27 MED ORDER — LIDOCAINE-EPINEPHRINE 1 %-1:100000 IJ SOLN
INTRAMUSCULAR | Status: AC
  Filled 2021-05-27: qty 1

## 2021-05-27 MED ORDER — MORPHINE SULFATE (PF) 4 MG/ML IV SOLN: 1.0000 mg | INTRAVENOUS | Status: DC | PRN

## 2021-05-27 MED ORDER — PROMETHAZINE HCL 25 MG/ML IJ SOLN: 6.2500 mg | INTRAMUSCULAR | Status: DC | PRN

## 2021-05-27 MED ORDER — ACETAMINOPHEN 500 MG PO TABS
500.0000 mg | ORAL_TABLET | ORAL | Status: DC | PRN
  Administered 2021-05-27: 500 mg via ORAL
  Filled 2021-05-27 (×2): qty 1

## 2021-05-27 MED ORDER — 0.9 % SODIUM CHLORIDE (POUR BTL) OPTIME
TOPICAL | Status: DC | PRN
  Administered 2021-05-27: 150 mL

## 2021-05-27 MED ORDER — PROPOFOL 10 MG/ML IV BOLUS
INTRAVENOUS | Status: DC | PRN
  Administered 2021-05-27: 150 mg via INTRAVENOUS
  Administered 2021-05-27: 50 mg via INTRAVENOUS

## 2021-05-27 MED ORDER — PROPOFOL 10 MG/ML IV BOLUS
INTRAVENOUS | Status: AC
  Filled 2021-05-27: qty 20

## 2021-05-27 MED ORDER — OXYCODONE HCL 5 MG PO TABS: 5.0000 mg | ORAL_TABLET | Freq: Once | ORAL | Status: DC | PRN

## 2021-05-27 MED ORDER — ONDANSETRON HCL 4 MG/2ML IJ SOLN
INTRAMUSCULAR | Status: AC
  Filled 2021-05-27: qty 2

## 2021-05-27 MED ORDER — AMOXICILLIN 875 MG PO TABS: 875.0000 mg | ORAL_TABLET | Freq: Two times a day (BID) | ORAL | 0 refills | Status: AC

## 2021-05-27 MED ORDER — MEPERIDINE HCL 25 MG/ML IJ SOLN: 6.2500 mg | INTRAMUSCULAR | Status: DC | PRN

## 2021-05-27 MED ORDER — PHENYLEPHRINE HCL (PRESSORS) 10 MG/ML IV SOLN
INTRAVENOUS | Status: DC | PRN
  Administered 2021-05-27 (×2): 80 ug via INTRAVENOUS

## 2021-05-27 MED ORDER — ONDANSETRON HCL 4 MG/2ML IJ SOLN
4.0000 mg | Freq: Four times a day (QID) | INTRAMUSCULAR | Status: DC | PRN
  Administered 2021-05-27: 4 mg via INTRAVENOUS
  Filled 2021-05-27 (×2): qty 2

## 2021-05-27 MED ORDER — LEVOTHYROXINE SODIUM 88 MCG PO TABS
88.0000 ug | ORAL_TABLET | Freq: Every day | ORAL | Status: DC
  Filled 2021-05-27: qty 1

## 2021-05-27 MED ORDER — EPHEDRINE SULFATE 50 MG/ML IJ SOLN
INTRAMUSCULAR | Status: DC | PRN
  Administered 2021-05-27: 15 mg via INTRAVENOUS
  Administered 2021-05-27: 10 mg via INTRAVENOUS

## 2021-05-27 MED ORDER — DEXAMETHASONE SODIUM PHOSPHATE 10 MG/ML IJ SOLN
INTRAMUSCULAR | Status: AC
  Filled 2021-05-27: qty 1

## 2021-05-27 MED ORDER — KCL IN DEXTROSE-NACL 20-5-0.45 MEQ/L-%-% IV SOLN
INTRAVENOUS | Status: DC
  Filled 2021-05-27: qty 1000

## 2021-05-27 MED ORDER — METOPROLOL SUCCINATE ER 25 MG PO TB24
25.0000 mg | ORAL_TABLET | Freq: Every day | ORAL | Status: DC
  Filled 2021-05-27: qty 1

## 2021-05-27 MED ORDER — AMISULPRIDE (ANTIEMETIC) 5 MG/2ML IV SOLN: 10.0000 mg | Freq: Once | INTRAVENOUS | Status: DC | PRN

## 2021-05-27 MED ORDER — ONDANSETRON HCL 4 MG/2ML IJ SOLN
INTRAMUSCULAR | Status: DC | PRN
  Administered 2021-05-27: 4 mg via INTRAVENOUS

## 2021-05-27 MED ORDER — CEFAZOLIN SODIUM-DEXTROSE 2-3 GM-%(50ML) IV SOLR
INTRAVENOUS | Status: DC | PRN
  Administered 2021-05-27: 2 g via INTRAVENOUS

## 2021-05-27 MED ORDER — DEXAMETHASONE SODIUM PHOSPHATE 4 MG/ML IJ SOLN
INTRAMUSCULAR | Status: DC | PRN
  Administered 2021-05-27: 10 mg via INTRAVENOUS

## 2021-05-27 MED ORDER — FENTANYL CITRATE (PF) 100 MCG/2ML IJ SOLN
INTRAMUSCULAR | Status: AC
  Filled 2021-05-27: qty 2

## 2021-05-27 MED ORDER — LIDOCAINE-EPINEPHRINE 1 %-1:100000 IJ SOLN
INTRAMUSCULAR | Status: DC | PRN
  Administered 2021-05-27: 3 mL

## 2021-05-27 MED ORDER — LACTATED RINGERS IV SOLN: INTRAVENOUS | Status: DC

## 2021-05-27 MED ORDER — OXYCODONE-ACETAMINOPHEN 5-325 MG PO TABS
1.0000 | ORAL_TABLET | ORAL | Status: DC | PRN
  Administered 2021-05-27: 1 via ORAL
  Filled 2021-05-27: qty 2
  Filled 2021-05-27: qty 1

## 2021-05-27 MED ORDER — LIDOCAINE 2% (20 MG/ML) 5 ML SYRINGE
INTRAMUSCULAR | Status: AC
  Filled 2021-05-27: qty 5

## 2021-05-27 MED ORDER — MIDAZOLAM HCL 2 MG/2ML IJ SOLN
INTRAMUSCULAR | Status: AC
  Filled 2021-05-27: qty 2

## 2021-05-27 MED ORDER — SUCCINYLCHOLINE CHLORIDE 200 MG/10ML IV SOSY
PREFILLED_SYRINGE | INTRAVENOUS | Status: DC | PRN
  Administered 2021-05-27: 140 mg via INTRAVENOUS

## 2021-05-27 MED ORDER — MIDAZOLAM HCL 5 MG/5ML IJ SOLN
INTRAMUSCULAR | Status: DC | PRN
  Administered 2021-05-27: 2 mg via INTRAVENOUS

## 2021-05-27 MED ORDER — FENTANYL CITRATE (PF) 100 MCG/2ML IJ SOLN
INTRAMUSCULAR | Status: DC | PRN
  Administered 2021-05-27: 100 ug via INTRAVENOUS

## 2021-05-27 MED ORDER — HYDROMORPHONE HCL 1 MG/ML IJ SOLN
0.2500 mg | INTRAMUSCULAR | Status: DC | PRN
  Administered 2021-05-27 (×4): 0.25 mg via INTRAVENOUS

## 2021-05-27 SURGICAL SUPPLY — 59 items
ADH SKN CLS APL DERMABOND .7 (GAUZE/BANDAGES/DRESSINGS) ×1
ATTRACTOMAT 16X20 MAGNETIC DRP (DRAPES) ×2 IMPLANT
BLADE CLIPPER SURG (BLADE) IMPLANT
BLADE SURG 10 STRL SS (BLADE) IMPLANT
BLADE SURG 15 STRL LF DISP TIS (BLADE) ×1 IMPLANT
BLADE SURG 15 STRL SS (BLADE) ×2
CANISTER SUCT 1200ML W/VALVE (MISCELLANEOUS) ×2 IMPLANT
CLIP TI WIDE RED SMALL 6 (CLIP) IMPLANT
CORD BIPOLAR FORCEPS 12FT (ELECTRODE) ×2 IMPLANT
COVER BACK TABLE 60X90IN (DRAPES) ×2 IMPLANT
COVER MAYO STAND STRL (DRAPES) ×2 IMPLANT
DECANTER SPIKE VIAL GLASS SM (MISCELLANEOUS) IMPLANT
DERMABOND ADVANCED (GAUZE/BANDAGES/DRESSINGS) ×1
DERMABOND ADVANCED .7 DNX12 (GAUZE/BANDAGES/DRESSINGS) ×1 IMPLANT
DRAIN CHANNEL 10F 3/8 F FF (DRAIN) IMPLANT
DRAPE U-SHAPE 76X120 STRL (DRAPES) ×2 IMPLANT
ELECT COATED BLADE 2.86 ST (ELECTRODE) ×2 IMPLANT
ELECT REM PT RETURN 9FT ADLT (ELECTROSURGICAL) ×2
ELECTRODE REM PT RTRN 9FT ADLT (ELECTROSURGICAL) ×1 IMPLANT
EVACUATOR SILICONE 100CC (DRAIN) IMPLANT
FORCEPS BIPOLAR SPETZLER 8 1.0 (NEUROSURGERY SUPPLIES) ×2 IMPLANT
GAUZE 4X4 16PLY ~~LOC~~+RFID DBL (SPONGE) ×4 IMPLANT
GAUZE SPONGE 4X4 12PLY STRL LF (GAUZE/BANDAGES/DRESSINGS) IMPLANT
GLOVE SURG ENC MOIS LTX SZ6.5 (GLOVE) IMPLANT
GLOVE SURG ENC MOIS LTX SZ7.5 (GLOVE) ×2 IMPLANT
GLOVE SURG POLYISO LF SZ6.5 (GLOVE) ×2 IMPLANT
GLOVE SURG UNDER POLY LF SZ7 (GLOVE) ×4 IMPLANT
GOWN STRL REUS W/ TWL LRG LVL3 (GOWN DISPOSABLE) ×2 IMPLANT
GOWN STRL REUS W/TWL LRG LVL3 (GOWN DISPOSABLE) ×4
HEMOSTAT SURGICEL 2X14 (HEMOSTASIS) IMPLANT
NEEDLE HYPO 25X1 1.5 SAFETY (NEEDLE) ×2 IMPLANT
NS IRRIG 1000ML POUR BTL (IV SOLUTION) ×2 IMPLANT
PACK BASIN DAY SURGERY FS (CUSTOM PROCEDURE TRAY) ×2 IMPLANT
PENCIL SMOKE EVACUATOR (MISCELLANEOUS) ×2 IMPLANT
PIN SAFETY STERILE (MISCELLANEOUS) IMPLANT
PROBE NERVBE PRASS .33 (MISCELLANEOUS) ×2 IMPLANT
SET WALTER ACTIVATION W/DRAPE (SET/KITS/TRAYS/PACK) ×2 IMPLANT
SHEARS HARMONIC 9CM CVD (BLADE) ×2 IMPLANT
SLEEVE SCD COMPRESS KNEE MED (STOCKING) ×2 IMPLANT
SPONGE INTESTINAL PEANUT (DISPOSABLE) ×2 IMPLANT
STAPLER VISISTAT 35W (STAPLE) IMPLANT
SUT ETHILON 3 0 PS 1 (SUTURE) ×2 IMPLANT
SUT PROLENE 5 0 P 3 (SUTURE) IMPLANT
SUT SILK 2 0 SH (SUTURE) ×2 IMPLANT
SUT SILK 2 0 TIES 17X18 (SUTURE)
SUT SILK 2-0 18XBRD TIE BLK (SUTURE) IMPLANT
SUT SILK 3 0 TIES 17X18 (SUTURE) ×2
SUT SILK 3-0 18XBRD TIE BLK (SUTURE) ×1 IMPLANT
SUT VIC AB 3-0 FS2 27 (SUTURE) ×2 IMPLANT
SUT VICRYL 4-0 PS2 18IN ABS (SUTURE) ×2 IMPLANT
SYR BULB EAR ULCER 3OZ GRN STR (SYRINGE) ×2 IMPLANT
SYR CONTROL 10ML LL (SYRINGE) ×2 IMPLANT
TOWEL GREEN STERILE FF (TOWEL DISPOSABLE) ×4 IMPLANT
TRAY DSU PREP LF (CUSTOM PROCEDURE TRAY) ×2 IMPLANT
TUBE CONNECTING 20X1/4 (TUBING) ×2 IMPLANT
TUBE ENDOTRAC NIMS EMG 6MM (MISCELLANEOUS) IMPLANT
TUBE ENDOTRAC NIMS EMG 7MM (MISCELLANEOUS) IMPLANT
TUBE ENDOTRAC NIMS EMG 8MM (MISCELLANEOUS) ×2 IMPLANT
TUBE ENDOTRAC NIMS EMG 9MM (MISCELLANEOUS) IMPLANT

## 2021-05-27 NOTE — Anesthesia Procedure Notes (Signed)
Procedure Name: Intubation Date/Time: 05/27/2021 9:32 AM Performed by: Verita Lamb, CRNA Pre-anesthesia Checklist: Patient identified, Emergency Drugs available, Suction available and Patient being monitored Patient Re-evaluated:Patient Re-evaluated prior to induction Oxygen Delivery Method: Circle system utilized Preoxygenation: Pre-oxygenation with 100% oxygen Induction Type: IV induction Ventilation: Mask ventilation without difficulty Laryngoscope Size: Glidescope, Mac and 3 Grade View: Grade I Tube type: Oral Tube size: 8.0 (nim) mm Number of attempts: 1 Airway Equipment and Method: Oral airway and Rigid stylet Placement Confirmation: ETT inserted through vocal cords under direct vision, positive ETCO2, breath sounds checked- equal and bilateral and CO2 detector Tube secured with: Tape Dental Injury: Teeth and Oropharynx as per pre-operative assessment

## 2021-05-27 NOTE — H&P (Signed)
Cc: Right thyroid mass  HPI: The patient is a 65 year old female who returns today for her follow-up evaluation.  The patient was previously seen for her right thyroid nodule.  Her thyroid ultrasound showed a 1.4 cm right thyroid nodule.  Her fine-needle aspiration biopsy showed atypia of undetermined significance.  The Afirma testing showed the risk of malignancy to be 50%.  The patient returns today reporting no new symptoms.  She denies any significant dysphagia, odynophagia, dysphonia, or dyspnea.  Exam: General: Communicates without difficulty, well nourished, no acute distress. Head: Normocephalic, no evidence injury, no tenderness, facial buttresses intact without stepoff. Face/sinus: No tenderness to palpation and percussion. Facial movement is normal and symmetric. Eyes: PERRL, EOMI. No scleral icterus, conjunctivae clear. Neuro: CN II exam reveals vision grossly intact.  No nystagmus at any point of gaze. Ears: Auricles well formed without lesions.  Ear canals are intact without mass or lesion.  No erythema or edema is appreciated.  The TMs are intact without fluid. Nose: External evaluation reveals normal support and skin without lesions.  Dorsum is intact.  Anterior rhinoscopy reveals pink mucosa over anterior aspect of inferior turbinates and intact septum.  No purulence noted. Oral:  Oral cavity and oropharynx are intact, symmetric, without erythema or edema.  Mucosa is moist without lesions. Neck: Full range of motion without pain.  There is no significant lymphadenopathy.  No masses palpable.  Thyroid bed is full to palpation.  Parotid glands and submandibular glands equal bilaterally without mass.  Trachea is midline. Neuro:  CN 2-12 grossly intact. Gait normal.   Assessment  1. The patient has a suspicious 1.4 cm right thyroid nodule.  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 2. The patient is currently asymptomatic.  She has no significant  dysphagia, dysphonia, or odynophagia. 3. Her vocal cords are mobile bilaterally.  Plan  1. The physical exam findings and the pathology results are extensively discussed with the patient. 2. The treatment options are extensively discussed.  The options include right hemithyroidectomy versus total thyroidectomy surgery.  If she proceeds with right hemithyroidectomy surgery, and the pathology was consistent with carcinoma, she will likely need to undergo completion thyroidectomy surgery.  This will be followed by radioactive iodine ablation. 3. The patient would like to proceed with the right hemithyroidectomy surgery procedure.  We will schedule the procedure in accordance with the patient's schedule.

## 2021-05-27 NOTE — Discharge Instructions (Signed)
Thyroidectomy, Care After This sheet gives you information about how to care for yourself after your procedure. Your health care provider may also give you more specific instructions. If you have problems or questions, contact your health care provider. What can I expect after the procedure? After the procedure, it is common to have: Mild pain in the neck or upper body, especially when swallowing. A swollen neck. A sore throat. A weak or hoarse voice. Slight tingling or numbness around your mouth, or in your fingers or toes. This may last for a day or two after surgery. This condition is caused by low levels of calcium. You may be given calcium supplements to treat it. Follow these instructions at home: Medicines Take over-the-counter and prescription medicines only as told by your health care provider. Do not drive or use heavy machinery while taking prescription pain medicine. Do not take medicines that contain aspirin and ibuprofen until your health care provider says that you can. These medicines can increase your risk of bleeding. Take a thyroid hormone medicine as recommended by your health care provider. You will have to take this medicine for the rest of your life if your entire thyroid was removed. Eating and drinking Start slowly with eating. You may need to have only liquids and soft foods for a few days or as directed by your health care provider. To prevent or treat constipation while you are taking prescription pain medicine, your health care provider may recommend that you: Drink enough fluid to keep your urine pale yellow. Take over-the-counter or prescription medicines. Eat foods that are high in fiber, such as fresh fruits and vegetables, whole grains, and beans. Limit foods that are high in fat and processed sugars, such as fried and sweet foods. Incision care Follow instructions from your health care provider about how to take care of your incision. Make sure you: Leave  stitches (sutures), skin glue, or adhesive strips in place. These skin closures may need to stay in place for 2 weeks or longer. If adhesive strip edges start to loosen and curl up, you may trim the loose edges. Do not remove adhesive strips completely unless your health care provider tells you to do that. Check your incision area every day for signs of infection. Check for: Redness, swelling, or pain. Fluid or blood. Warmth. Pus or a bad smell. Activity For the first 10 days after the procedure or as instructed by your health care provider: Do not lift anything that is heavier than 10 lb (4.5 kg). Do not jog, swim, or do other strenuous exercises. Do not play contact sports. Avoid sitting for a long time without moving. Get up to take short walks every 1-2 hours. This is needed to improve blood flow and breathing. Ask for help if you feel weak or unsteady. Return to your normal activities as told by your health care provider. Ask your health care provider what activities are safe for you. General instructions Do not use any products that contain nicotine or tobacco, such as cigarettes and e-cigarettes. These can delay healing after surgery. If you need help quitting, ask your health care provider. Keep all follow-up visits as told by your health care provider. This is important. Your health care provider needs to monitor the calcium level in your blood to make sure that it does not become low. Contact a health care provider if you: Have a fever. Have more redness, swelling, or pain around your incision area. Have fluid or blood coming from your  incision area. Notice that your incision area feels warm to the touch. Have pus or a bad smell coming from your incision area. Have trouble talking. Have nausea or vomiting for more than 2 days. Get help right away if you: Have trouble breathing. Have trouble swallowing. Develop a rash. Develop a cough that gets worse. Notice that your speech  changes, or you have hoarseness that gets worse. Develop numbness, tingling, or muscle spasms in the arms, hands, feet, or face. Summary After the procedure, it is common to feel mild pain in the neck or upper body, especially when swallowing. Take medicines as told by your health care provider. These include pain medicines and thyroid hormones, if required. Follow instructions from your health care provider about how to take care of your incision. Watch for signs of infection. Keep all follow-up visits as told by your health care provider. This is important. Your health care provider needs to monitor the calcium level in your blood to make sure that it does not become low. Get help right away if you develop difficulty breathing, or numbness, tingling, or muscle spasms in the arms, hands, feet, or face. This information is not intended to replace advice given to you by your health care provider. Make sure you discuss any questions you have with your health care provider. Document Revised: 02/19/2020 Document Reviewed: 02/19/2020 Elsevier Patient Education  [place].

## 2021-05-27 NOTE — Anesthesia Preprocedure Evaluation (Signed)
Anesthesia Evaluation  Patient identified by MRN, date of birth, ID band Patient awake    Reviewed: Allergy & Precautions, H&P , NPO status , Patient's Chart, lab work & pertinent test results  Airway Mallampati: II  TM Distance: >3 FB Neck ROM: Full    Dental no notable dental hx.    Pulmonary neg pulmonary ROS,    Pulmonary exam normal breath sounds clear to auscultation       Cardiovascular hypertension, Pt. on medications negative cardio ROS Normal cardiovascular exam Rhythm:Regular Rate:Normal     Neuro/Psych negative neurological ROS  negative psych ROS   GI/Hepatic negative GI ROS, Neg liver ROS,   Endo/Other  Hypothyroidism   Renal/GU negative Renal ROS  negative genitourinary   Musculoskeletal negative musculoskeletal ROS (+)   Abdominal   Peds negative pediatric ROS (+)  Hematology negative hematology ROS (+)   Anesthesia Other Findings   Reproductive/Obstetrics negative OB ROS                             Anesthesia Physical Anesthesia Plan  ASA: 2  Anesthesia Plan: General   Post-op Pain Management:    Induction: Intravenous  PONV Risk Score and Plan: 3 and Ondansetron, Dexamethasone, Midazolam and Treatment may vary due to age or medical condition  Airway Management Planned: Oral ETT  Additional Equipment:   Intra-op Plan:   Post-operative Plan: Extubation in OR  Informed Consent: I have reviewed the patients History and Physical, chart, labs and discussed the procedure including the risks, benefits and alternatives for the proposed anesthesia with the patient or authorized representative who has indicated his/her understanding and acceptance.     Dental advisory given  Plan Discussed with: CRNA  Anesthesia Plan Comments:         Anesthesia Quick Evaluation

## 2021-05-27 NOTE — Anesthesia Postprocedure Evaluation (Signed)
Anesthesia Post Note  Patient: Courtney Elliott  Procedure(s) Performed: RIGHT HEMI THYROIDECTOMY (Right: Neck)     Patient location during evaluation: PACU Anesthesia Type: General Level of consciousness: awake and alert Pain management: pain level controlled Vital Signs Assessment: post-procedure vital signs reviewed and stable Respiratory status: spontaneous breathing, nonlabored ventilation and respiratory function stable Cardiovascular status: blood pressure returned to baseline and stable Postop Assessment: no apparent nausea or vomiting Anesthetic complications: no   No notable events documented.  Last Vitals:  Vitals:   05/27/21 1100 05/27/21 1115  BP: (!) 184/106 (!) 189/104  Pulse: 71 75  Resp: 11 15  Temp:    SpO2: 100% 100%    Last Pain:  Vitals:   05/27/21 1115  TempSrc:   PainSc: [place]

## 2021-05-27 NOTE — Op Note (Signed)
DATE OF PROCEDURE:  05/27/2021                              OPERATIVE REPORT  SURGEON:  Leta Baptist, MD  PREOPERATIVE DIAGNOSES: 1. Right  thyroid mass.   POSTOPERATIVE DIAGNOSES: 1. Right thyroid mass.   PROCEDURE PERFORMED: Right total thyroid lobectomy.   ANESTHESIA:  General endotracheal tube anesthesia.   COMPLICATIONS:  None.   ESTIMATED BLOOD LOSS: Minimal   INDICATION FOR PROCEDURE: Courtney Elliott is a 65 y.o. female with a history of a right thyroid nodule. The patient underwent a thyroid ultrasound which showed a 1.4 cm right thyroid nodule. The patient underwent FNA with atypia noted. AFIRMA testing was performed with 50% chance of malignancy noted. Based on the above findings, the decision was made for the patient to undergo the above stated procedure.  The risks, benefits, alternatives, and details of the procedure were discussed with the patient.  Questions were invited and answered.  Informed consent was obtained.   DESCRIPTION:  The patient was taken to the operating room and placed supine on the operating table.  General endotracheal tube anesthesia was administered by the anesthesiologist.  A nerve monitoring endotracheal tube was used.  The nerve monitoring system was functional throughout the case.   The patient was positioned and prepped and draped in the standard fashion for thyroid surgery.  1% lidocaine with 1 100,000 epinephrine was infiltrated at the planned site of incision.  A transverse lower neck incision was made.  The incision was carried down to the level of the platysma muscles.  Superiorly based and inferiorly based subplatysmal flaps were elevated in the standard fashion.  The strap muscles were retracted laterally, exposing the thyroid gland.   Careful dissection was performed to free the right thyroid lobe from the surrounding soft tissue.  The patient was noted to have a firm 1.4 cm right thyroid nodule.  The right recurrent laryngeal nerve was identified  and preserved.  The nerve was noted to be functional throughout the case.  An inferior right parathyroid gland was also identified and preserved.  The entire right thyroid lobe was resected and sent to the pathology department for permanent histologic identification.  A #10 JP drain was placed.  The strap muscles were reapproximated with 4-0 Vicryl sutures.  The incision was closed in layers with 4-0 Vicryl and Dermabond.   The care of the patient was turned over to the anesthesiologist.  The patient was awakened from anesthesia without difficulty.  The patient was extubated and transferred to the recovery room in good condition.   OPERATIVE FINDINGS:  A firm 1.4 cm right thyroid mass.   SPECIMEN: Right thyroid lobe.   FOLLOWUP CARE:  The patient will be admitted for overnight observation.  Courtney Elliott 05/27/2021 10:15 AM

## 2021-05-27 NOTE — H&P (View-Only) (Signed)
Cc: Right thyroid mass  HPI: The patient is a 65 year old female who returns today for her follow-up evaluation.  The patient was previously seen for her right thyroid nodule.  Her thyroid ultrasound showed a 1.4 cm right thyroid nodule.  Her fine-needle aspiration biopsy showed atypia of undetermined significance.  The Afirma testing showed the risk of malignancy to be 50%.  The patient returns today reporting no new symptoms.  She denies any significant dysphagia, odynophagia, dysphonia, or dyspnea.  Exam: General: Communicates without difficulty, well nourished, no acute distress. Head: Normocephalic, no evidence injury, no tenderness, facial buttresses intact without stepoff. Face/sinus: No tenderness to palpation and percussion. Facial movement is normal and symmetric. Eyes: PERRL, EOMI. No scleral icterus, conjunctivae clear. Neuro: CN II exam reveals vision grossly intact.  No nystagmus at any point of gaze. Ears: Auricles well formed without lesions.  Ear canals are intact without mass or lesion.  No erythema or edema is appreciated.  The TMs are intact without fluid. Nose: External evaluation reveals normal support and skin without lesions.  Dorsum is intact.  Anterior rhinoscopy reveals pink mucosa over anterior aspect of inferior turbinates and intact septum.  No purulence noted. Oral:  Oral cavity and oropharynx are intact, symmetric, without erythema or edema.  Mucosa is moist without lesions. Neck: Full range of motion without pain.  There is no significant lymphadenopathy.  No masses palpable.  Thyroid bed is full to palpation.  Parotid glands and submandibular glands equal bilaterally without mass.  Trachea is midline. Neuro:  CN 2-12 grossly intact. Gait normal.   Assessment  1. The patient has a suspicious 1.4 cm right thyroid nodule.  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 2. The patient is currently asymptomatic.  She has no significant  dysphagia, dysphonia, or odynophagia. 3. Her vocal cords are mobile bilaterally.  Plan  1. The physical exam findings and the pathology results are extensively discussed with the patient. 2. The treatment options are extensively discussed.  The options include right hemithyroidectomy versus total thyroidectomy surgery.  If she proceeds with right hemithyroidectomy surgery, and the pathology was consistent with carcinoma, she will likely need to undergo completion thyroidectomy surgery.  This will be followed by radioactive iodine ablation. 3. The patient would like to proceed with the right hemithyroidectomy surgery procedure.  We will schedule the procedure in accordance with the patient's schedule.

## 2021-05-27 NOTE — Transfer of Care (Signed)
Immediate Anesthesia Transfer of Care Note  Patient: Courtney Elliott  Procedure(s) Performed: RIGHT HEMI THYROIDECTOMY (Right: Neck)  Patient Location: PACU  Anesthesia Type:General  Level of Consciousness: awake, alert  and oriented  Airway & Oxygen Therapy: Patient Spontanous Breathing and Patient connected to face mask oxygen  Post-op Assessment: Report given to RN and Post -op Vital signs reviewed and stable  Post vital signs: Reviewed and stable  Last Vitals:  Vitals Value Taken Time  BP    Temp    Pulse 78 05/27/21 1029  Resp 12 05/27/21 1029  SpO2 100 % 05/27/21 1029  Vitals shown include unvalidated device data.  Last Pain:  Vitals:   05/27/21 0745  TempSrc: Oral  PainSc: 0-No pain         Complications: No notable events documented.

## 2021-05-28 NOTE — Discharge Summary (Signed)
Physician Discharge Summary  Patient ID: Courtney Elliott MRN: [id number] DOB/AGE: [birth year] 65 y.o.  Admit date: 05/27/2021 Discharge date: 05/28/2021  Admission Diagnoses: Right thyroid mass  Discharge Diagnoses: Right thyroid mass Principal Problem:   S/P partial thyroidectomy   Discharged Condition: good  Hospital Course: Pt had an uneventful overnight stay. Pt tolerated po well. No bleeding. No stridor. Voice is good.  Consults: None  Significant Diagnostic Studies: None  Treatments: surgery: Right hemithyroidectomy  Discharge Exam: Blood pressure 116/75, pulse 77, temperature 99 F (37.2 C), resp. rate 16, height 5\' 5"  (1.651 m), weight 50.2 kg, SpO2 97 %. Incision c/d/i Voice is strong  Disposition: Discharge disposition: 01-Home or Self Care       Discharge Instructions     Activity as tolerated - No restrictions   Complete by: As directed    Diet general   Complete by: As directed    No wound care   Complete by: As directed       Allergies as of 05/28/2021   No Known Allergies      Medication List     TAKE these medications    amoxicillin 875 MG tablet Commonly known as: AMOXIL Take 1 tablet (875 mg total) by mouth 2 (two) times daily for 3 days.   levothyroxine 88 MCG tablet Commonly known as: SYNTHROID Take 1 tablet (88 mcg total) by mouth daily.   metoprolol succinate 25 MG 24 hr tablet Commonly known as: TOPROL-XL Take 1 tablet (25 mg total) by mouth daily. (NEEDS TO BE SEEN BEFORE NEXT REFILL)   multivitamin capsule Take 1 capsule by mouth daily.   oxyCODONE-acetaminophen 5-325 MG tablet Commonly known as: Percocet Take 1 tablet by mouth every 4 (four) hours as needed for up to 3 days for severe pain.   VITAMIN D (ERGOCALCIFEROL) PO Take by mouth.        Follow-up Information     Leta Baptist, MD Follow up on 06/03/2021.   Specialty: Otolaryngology Why: at 12:40pm Contact information: [address] [place]  [id number] [phone]                 Signed: Burley Saver 05/28/2021, 7:04 AM

## 2021-05-29 ENCOUNTER — Other Ambulatory Visit: Payer: Self-pay | Admitting: Nurse Practitioner

## 2021-05-29 DIAGNOSIS — I10 Essential (primary) hypertension: Secondary | ICD-10-CM

## 2021-05-30 ENCOUNTER — Encounter (HOSPITAL_BASED_OUTPATIENT_CLINIC_OR_DEPARTMENT_OTHER): Payer: Self-pay | Admitting: Otolaryngology

## 2021-05-30 NOTE — Telephone Encounter (Signed)
[place]. NTBS 30 days given 05/04/21

## 2021-05-31 NOTE — Telephone Encounter (Signed)
PT declined to make appt stated she just had surgery and has f/u appt on Friday 06/03/2021, she will call us then to make appt for med refill

## 2021-06-02 LAB — SURGICAL PATHOLOGY

## 2021-06-03 NOTE — Progress Notes (Signed)
DUE TO COVID-19 ONLY ONE VISITOR IS ALLOWED TO COME WITH YOU AND STAY IN THE WAITING ROOM ONLY DURING PRE OP AND PROCEDURE DAY OF SURGERY.   Two VISITORS MAY VISIT WITH YOU AFTER SURGERY IN YOUR PRIVATE ROOM DURING VISITING HOURS ONLY!  PCP - Dr Ronnie Doss Cardiologist - n/a Endocrinology - Dr Jacelyn Pi  Chest x-ray - n/a EKG - 05/26/21 Stress Test - n/a ECHO - 03/03/08 Cardiac Cath - n.a  ICD Pacemaker/Loop - n/a  Sleep Study -  n/a CPAP - none  Anesthesia review: Yes  STOP now taking any Aspirin (unless otherwise instructed by your surgeon), Aleve, Naproxen, Ibuprofen, Motrin, Advil, Goody's, BC's, all herbal medications, fish oil, and all vitamins.   Coronavirus Screening Covid test is scheduled on DOS

## 2021-06-03 NOTE — Anesthesia Preprocedure Evaluation (Addendum)
Anesthesia Evaluation  Patient identified by MRN, date of birth, ID band Patient awake    Reviewed: Allergy & Precautions, NPO status , Patient's Chart, lab work & pertinent test results  History of Anesthesia Complications (+) PONV  Airway Mallampati: I  TM Distance: >3 FB Neck ROM: Full    Dental no notable dental hx.    Pulmonary neg pulmonary ROS,    Pulmonary exam normal        Cardiovascular hypertension, Pt. on medications and Pt. on home beta blockers  Rhythm:Regular Rate:Normal     Neuro/Psych negative neurological ROS  negative psych ROS   GI/Hepatic negative GI ROS, Neg liver ROS,   Endo/Other  Hypothyroidism   Renal/GU   negative genitourinary   Musculoskeletal negative musculoskeletal ROS (+)   Abdominal Normal abdominal exam  (+)   Peds  Hematology  (+) anemia ,   Anesthesia Other Findings   Reproductive/Obstetrics                            Anesthesia Physical Anesthesia Plan  ASA: 2  Anesthesia Plan: General   Post-op Pain Management:    Induction: Intravenous  PONV Risk Score and Plan: 3 and Ondansetron, Dexamethasone, Midazolam, Treatment may vary due to age or medical condition, Aprepitant and Scopolamine patch - Pre-op  Airway Management Planned: Mask and Oral ETT  Additional Equipment: None  Intra-op Plan:   Post-operative Plan: Extubation in OR  Informed Consent: I have reviewed the patients History and Physical, chart, labs and discussed the procedure including the risks, benefits and alternatives for the proposed anesthesia with the patient or authorized representative who has indicated his/her understanding and acceptance.     Dental advisory given  Plan Discussed with: CRNA  Anesthesia Plan Comments: (Lab Results      Component                Value               Date                      WBC                      4.6                  09/24/2020                HGB                      12.5                09/24/2020                HCT                      41.2                09/24/2020                MCV                      83                  09/24/2020                PLT  183                 09/24/2020           Lab Results      Component                Value               Date                      NA                       140                 09/24/2020                K                        4.9                 09/24/2020                CO2                      22                  09/24/2020                GLUCOSE                  87                  09/24/2020                BUN                      15                  09/24/2020                CREATININE               0.85                09/24/2020                CALCIUM                  9.4                 09/24/2020                EGFR                     77                  09/24/2020                GFRNONAA                 63                  03/28/2019          )       Anesthesia Quick Evaluation

## 2021-06-04 ENCOUNTER — Ambulatory Visit (HOSPITAL_COMMUNITY): Payer: BC Managed Care – PPO | Admitting: Anesthesiology

## 2021-06-04 ENCOUNTER — Encounter (HOSPITAL_COMMUNITY)
Admission: RE | Disposition: A | Discharge status: Home / Self Care | Payer: Self-pay | Source: Ambulatory Visit | Attending: Otolaryngology

## 2021-06-04 ENCOUNTER — Encounter (HOSPITAL_COMMUNITY): Payer: Self-pay | Admitting: Otolaryngology

## 2021-06-04 ENCOUNTER — Ambulatory Visit (HOSPITAL_COMMUNITY): Payer: BC Managed Care – PPO | Admitting: Physician Assistant

## 2021-06-04 ENCOUNTER — Ambulatory Visit (HOSPITAL_COMMUNITY)
Admission: RE | Admit: 2021-06-04 | Discharge: 2021-06-05 | Disposition: A | Discharge status: Home / Self Care | Payer: BC Managed Care – PPO | Source: Ambulatory Visit | Attending: Otolaryngology | Admitting: Otolaryngology

## 2021-06-04 DIAGNOSIS — Z20822 Contact with and (suspected) exposure to covid-19: Secondary | ICD-10-CM | POA: Insufficient documentation

## 2021-06-04 DIAGNOSIS — E039 Hypothyroidism, unspecified: Secondary | ICD-10-CM | POA: Diagnosis not present

## 2021-06-04 DIAGNOSIS — C73 Malignant neoplasm of thyroid gland: Secondary | ICD-10-CM | POA: Diagnosis not present

## 2021-06-04 DIAGNOSIS — Z9889 Other specified postprocedural states: Secondary | ICD-10-CM

## 2021-06-04 DIAGNOSIS — E890 Postprocedural hypothyroidism: Secondary | ICD-10-CM

## 2021-06-04 DIAGNOSIS — D649 Anemia, unspecified: Secondary | ICD-10-CM | POA: Diagnosis not present

## 2021-06-04 DIAGNOSIS — I10 Essential (primary) hypertension: Secondary | ICD-10-CM | POA: Insufficient documentation

## 2021-06-04 DIAGNOSIS — E063 Autoimmune thyroiditis: Secondary | ICD-10-CM | POA: Diagnosis not present

## 2021-06-04 LAB — CBC
HCT: 39.5 % (ref 36.0–46.0)
Hemoglobin: 12.1 g/dL (ref 12.0–15.0)
MCH: 25 pg — ABNORMAL LOW (ref 26.0–34.0)
MCHC: 30.6 g/dL (ref 30.0–36.0)
MCV: 81.6 fL (ref 80.0–100.0)
Platelets: 151 10*3/uL (ref 150–400)
RBC: 4.84 MIL/uL (ref 3.87–5.11)
RDW: 13.6 % (ref 11.5–15.5)
WBC: 4 10*3/uL (ref 4.0–10.5)
nRBC: 0 % (ref 0.0–0.2)

## 2021-06-04 LAB — BASIC METABOLIC PANEL
Anion gap: 7 (ref 5–15)
BUN: 15 mg/dL (ref 8–23)
CO2: 25 mmol/L (ref 22–32)
Calcium: 9.4 mg/dL (ref 8.9–10.3)
Chloride: 105 mmol/L (ref 98–111)
Creatinine, Ser: 0.79 mg/dL (ref 0.44–1.00)
GFR, Estimated: >60 mL/min (ref >60)
Glucose, Bld: 98 mg/dL (ref 70–99)
Potassium: 4.5 mmol/L (ref 3.5–5.1)
Sodium: 137 mmol/L (ref 135–145)

## 2021-06-04 LAB — SARS CORONAVIRUS 2 BY RT PCR (HOSPITAL ORDER, PERFORMED IN ~~LOC~~ HOSPITAL LAB): SARS Coronavirus 2: NEGATIVE

## 2021-06-04 LAB — CALCIUM
Calcium: 8.8 mg/dL — ABNORMAL LOW (ref 8.9–10.3)
Calcium: 8.8 mg/dL — ABNORMAL LOW (ref 8.9–10.3)

## 2021-06-04 SURGERY — THYROIDECTOMY, COMPLETION
Anesthesia: General | Site: Neck

## 2021-06-04 MED ORDER — SCOPOLAMINE 1 MG/3DAYS TD PT72
1.0000 | MEDICATED_PATCH | TRANSDERMAL | Status: DC
  Administered 2021-06-04: 1.5 mg via TRANSDERMAL
  Filled 2021-06-04: qty 1

## 2021-06-04 MED ORDER — CEFAZOLIN SODIUM-DEXTROSE 2-4 GM/100ML-% IV SOLN
INTRAVENOUS | Status: AC
  Filled 2021-06-04: qty 100

## 2021-06-04 MED ORDER — ONDANSETRON HCL 4 MG/2ML IJ SOLN
4.0000 mg | INTRAMUSCULAR | Status: DC | PRN
  Filled 2021-06-04: qty 2

## 2021-06-04 MED ORDER — FENTANYL CITRATE (PF) 100 MCG/2ML IJ SOLN
25.0000 ug | INTRAMUSCULAR | Status: DC | PRN
  Administered 2021-06-04: 50 ug via INTRAVENOUS

## 2021-06-04 MED ORDER — MIDAZOLAM HCL 2 MG/2ML IJ SOLN
INTRAMUSCULAR | Status: DC | PRN
  Administered 2021-06-04: 2 mg via INTRAVENOUS

## 2021-06-04 MED ORDER — CHLORHEXIDINE GLUCONATE 0.12 % MT SOLN: 15.0000 mL | Freq: Once | OROMUCOSAL | Status: AC

## 2021-06-04 MED ORDER — ONDANSETRON HCL 4 MG/2ML IJ SOLN
INTRAMUSCULAR | Status: DC | PRN
  Administered 2021-06-04: 4 mg via INTRAVENOUS

## 2021-06-04 MED ORDER — ONDANSETRON HCL 4 MG PO TABS: 4.0000 mg | ORAL_TABLET | ORAL | Status: DC | PRN

## 2021-06-04 MED ORDER — SUCCINYLCHOLINE CHLORIDE 200 MG/10ML IV SOSY
PREFILLED_SYRINGE | INTRAVENOUS | Status: AC
  Filled 2021-06-04: qty 10

## 2021-06-04 MED ORDER — DEXAMETHASONE SODIUM PHOSPHATE 10 MG/ML IJ SOLN
INTRAMUSCULAR | Status: AC
  Filled 2021-06-04: qty 1

## 2021-06-04 MED ORDER — 0.9 % SODIUM CHLORIDE (POUR BTL) OPTIME
TOPICAL | Status: DC | PRN
  Administered 2021-06-04: 1000 mL

## 2021-06-04 MED ORDER — FENTANYL CITRATE (PF) 250 MCG/5ML IJ SOLN
INTRAMUSCULAR | Status: AC
  Filled 2021-06-04: qty 5

## 2021-06-04 MED ORDER — PHENYLEPHRINE 40 MCG/ML (10ML) SYRINGE FOR IV PUSH (FOR BLOOD PRESSURE SUPPORT)
PREFILLED_SYRINGE | INTRAVENOUS | Status: AC
  Filled 2021-06-04: qty 10

## 2021-06-04 MED ORDER — LACTATED RINGERS IV SOLN: INTRAVENOUS | Status: DC

## 2021-06-04 MED ORDER — ACETAMINOPHEN 160 MG/5ML PO SOLN: 650.0000 mg | ORAL | Status: DC | PRN

## 2021-06-04 MED ORDER — CHLORHEXIDINE GLUCONATE 0.12 % MT SOLN
OROMUCOSAL | Status: AC
  Administered 2021-06-04: 15 mL via OROMUCOSAL
  Filled 2021-06-04: qty 15

## 2021-06-04 MED ORDER — PHENYLEPHRINE 40 MCG/ML (10ML) SYRINGE FOR IV PUSH (FOR BLOOD PRESSURE SUPPORT)
PREFILLED_SYRINGE | INTRAVENOUS | Status: DC | PRN
  Administered 2021-06-04: 80 ug via INTRAVENOUS

## 2021-06-04 MED ORDER — DEXAMETHASONE SODIUM PHOSPHATE 10 MG/ML IJ SOLN
INTRAMUSCULAR | Status: DC | PRN
  Administered 2021-06-04: 10 mg via INTRAVENOUS

## 2021-06-04 MED ORDER — MIDAZOLAM HCL 2 MG/2ML IJ SOLN
INTRAMUSCULAR | Status: AC
  Filled 2021-06-04: qty 2

## 2021-06-04 MED ORDER — APREPITANT 40 MG PO CAPS
40.0000 mg | ORAL_CAPSULE | Freq: Once | ORAL | Status: AC
  Administered 2021-06-04: 40 mg via ORAL
  Filled 2021-06-04: qty 1

## 2021-06-04 MED ORDER — ONDANSETRON HCL 4 MG/2ML IJ SOLN
INTRAMUSCULAR | Status: AC
  Filled 2021-06-04: qty 2

## 2021-06-04 MED ORDER — MORPHINE SULFATE (PF) 2 MG/ML IV SOLN: 2.0000 mg | INTRAVENOUS | Status: DC | PRN

## 2021-06-04 MED ORDER — LIDOCAINE-EPINEPHRINE 1 %-1:100000 IJ SOLN
INTRAMUSCULAR | Status: AC
  Filled 2021-06-04: qty 1

## 2021-06-04 MED ORDER — LEVOTHYROXINE SODIUM 88 MCG PO TABS
88.0000 ug | ORAL_TABLET | Freq: Every day | ORAL | Status: DC
  Administered 2021-06-05: 88 ug via ORAL
  Filled 2021-06-04 (×2): qty 1

## 2021-06-04 MED ORDER — CEFAZOLIN SODIUM-DEXTROSE 2-4 GM/100ML-% IV SOLN
2.0000 g | Freq: Once | INTRAVENOUS | Status: AC
  Administered 2021-06-04: 2 g via INTRAVENOUS

## 2021-06-04 MED ORDER — OYSTER SHELL CALCIUM/D3 500-5 MG-MCG PO TABS
2.0000 | ORAL_TABLET | Freq: Two times a day (BID) | ORAL | Status: DC
  Administered 2021-06-04 – 2021-06-05 (×2): 2 via ORAL
  Filled 2021-06-04 (×2): qty 2

## 2021-06-04 MED ORDER — ACETAMINOPHEN 650 MG RE SUPP: 650.0000 mg | RECTAL | Status: DC | PRN

## 2021-06-04 MED ORDER — FENTANYL CITRATE (PF) 100 MCG/2ML IJ SOLN
INTRAMUSCULAR | Status: AC
  Filled 2021-06-04: qty 2

## 2021-06-04 MED ORDER — METOPROLOL SUCCINATE ER 25 MG PO TB24
25.0000 mg | ORAL_TABLET | Freq: Every day | ORAL | Status: DC
  Administered 2021-06-05: 25 mg via ORAL

## 2021-06-04 MED ORDER — PROPOFOL 10 MG/ML IV BOLUS
INTRAVENOUS | Status: AC
  Filled 2021-06-04: qty 20

## 2021-06-04 MED ORDER — AMISULPRIDE (ANTIEMETIC) 5 MG/2ML IV SOLN: 10.0000 mg | Freq: Once | INTRAVENOUS | Status: DC | PRN

## 2021-06-04 MED ORDER — LIDOCAINE 2% (20 MG/ML) 5 ML SYRINGE
INTRAMUSCULAR | Status: AC
  Filled 2021-06-04: qty 5

## 2021-06-04 MED ORDER — ACETAMINOPHEN 10 MG/ML IV SOLN: 1000.0000 mg | Freq: Once | INTRAVENOUS | Status: DC | PRN

## 2021-06-04 MED ORDER — FENTANYL CITRATE (PF) 100 MCG/2ML IJ SOLN
INTRAMUSCULAR | Status: DC | PRN
  Administered 2021-06-04: 50 ug via INTRAVENOUS
  Administered 2021-06-04: 100 ug via INTRAVENOUS

## 2021-06-04 MED ORDER — OXYCODONE-ACETAMINOPHEN 5-325 MG PO TABS: 1.0000 | ORAL_TABLET | ORAL | Status: DC | PRN

## 2021-06-04 MED ORDER — LIDOCAINE-EPINEPHRINE 1 %-1:100000 IJ SOLN
INTRAMUSCULAR | Status: DC | PRN
  Administered 2021-06-04: 2 mL

## 2021-06-04 MED ORDER — LIDOCAINE 2% (20 MG/ML) 5 ML SYRINGE
INTRAMUSCULAR | Status: DC | PRN
  Administered 2021-06-04: 40 mg via INTRAVENOUS

## 2021-06-04 MED ORDER — KCL IN DEXTROSE-NACL 20-5-0.45 MEQ/L-%-% IV SOLN: INTRAVENOUS | Status: DC

## 2021-06-04 MED ORDER — ORAL CARE MOUTH RINSE: 15.0000 mL | Freq: Once | OROMUCOSAL | Status: AC

## 2021-06-04 MED ORDER — SUCCINYLCHOLINE CHLORIDE 200 MG/10ML IV SOSY
PREFILLED_SYRINGE | INTRAVENOUS | Status: DC | PRN
  Administered 2021-06-04: 100 mg via INTRAVENOUS

## 2021-06-04 MED ORDER — PROPOFOL 10 MG/ML IV BOLUS
INTRAVENOUS | Status: DC | PRN
  Administered 2021-06-04: 170 mg via INTRAVENOUS

## 2021-06-04 SURGICAL SUPPLY — 53 items
ADH SKN CLS APL DERMABOND .7 (GAUZE/BANDAGES/DRESSINGS) ×1
APL SKNCLS STERI-STRIP NONHPOA (GAUZE/BANDAGES/DRESSINGS)
ATTRACTOMAT 16X20 MAGNETIC DRP (DRAPES) IMPLANT
BAG COUNTER SPONGE SURGICOUNT (BAG) ×2 IMPLANT
BENZOIN TINCTURE PRP APPL 2/3 (GAUZE/BANDAGES/DRESSINGS) IMPLANT
BLADE CLIPPER SURG (BLADE) IMPLANT
BLADE SURG 15 STRL LF DISP TIS (BLADE) IMPLANT
BLADE SURG 15 STRL SS (BLADE)
CANISTER SUCT 3000ML PPV (MISCELLANEOUS) ×2 IMPLANT
CLEANER TIP ELECTROSURG 2X2 (MISCELLANEOUS) ×2 IMPLANT
CLIP VESOCCLUDE SM WIDE 24/CT (CLIP) ×2 IMPLANT
CNTNR URN SCR LID CUP LEK RST (MISCELLANEOUS) IMPLANT
CONT SPEC 4OZ STRL OR WHT (MISCELLANEOUS)
CORD BIPOLAR FORCEPS 12FT (ELECTRODE) ×2 IMPLANT
COVER SURGICAL LIGHT HANDLE (MISCELLANEOUS) ×2 IMPLANT
DERMABOND ADVANCED (GAUZE/BANDAGES/DRESSINGS) ×1
DERMABOND ADVANCED .7 DNX12 (GAUZE/BANDAGES/DRESSINGS) ×1 IMPLANT
DRAIN CHANNEL 10F 3/8 F FF (DRAIN) ×2 IMPLANT
DRAPE HALF SHEET 40X57 (DRAPES) IMPLANT
ELECT COATED BLADE 2.86 ST (ELECTRODE) ×2 IMPLANT
ELECT REM PT RETURN 9FT ADLT (ELECTROSURGICAL) ×2
ELECTRODE REM PT RTRN 9FT ADLT (ELECTROSURGICAL) ×1 IMPLANT
EVACUATOR SILICONE 100CC (DRAIN) ×2 IMPLANT
GAUZE 4X4 16PLY ~~LOC~~+RFID DBL (SPONGE) IMPLANT
GLOVE SURG ENC MOIS LTX SZ6.5 (GLOVE) IMPLANT
GLOVE SURG LTX SZ7.5 (GLOVE) ×2 IMPLANT
GLOVE SURG UNDER POLY LF SZ6.5 (GLOVE) IMPLANT
GOWN STRL REUS W/ TWL LRG LVL3 (GOWN DISPOSABLE) ×3 IMPLANT
GOWN STRL REUS W/TWL LRG LVL3 (GOWN DISPOSABLE) ×6
HEMOSTAT SURGICEL 2X14 (HEMOSTASIS) IMPLANT
KIT BASIN OR (CUSTOM PROCEDURE TRAY) ×2 IMPLANT
KIT TURNOVER KIT B (KITS) ×2 IMPLANT
LOCATOR NERVE 3 VOLT (DISPOSABLE) ×2 IMPLANT
NEEDLE HYPO 25GX1X1/2 BEV (NEEDLE) ×2 IMPLANT
NS IRRIG 1000ML POUR BTL (IV SOLUTION) ×2 IMPLANT
PAD ARMBOARD 7.5X6 YLW CONV (MISCELLANEOUS) ×2 IMPLANT
PENCIL SMOKE EVACUATOR (MISCELLANEOUS) ×2 IMPLANT
POSITIONER HEAD DONUT 9IN (MISCELLANEOUS) ×2 IMPLANT
PROBE NERVBE PRASS .33 (MISCELLANEOUS) ×2 IMPLANT
SHEARS HARMONIC 9CM CVD (BLADE) ×2 IMPLANT
SPONGE INTESTINAL PEANUT (DISPOSABLE) ×4 IMPLANT
SUT ETHILON 2 0 FS 18 (SUTURE) ×2 IMPLANT
SUT PROLENE 6 0 CC 1 (SUTURE) IMPLANT
SUT SILK 2 0 PERMA HAND 18 BK (SUTURE) ×2 IMPLANT
SUT SILK 3 0 REEL (SUTURE) ×2 IMPLANT
SUT VICRYL 4-0 PS2 18IN ABS (SUTURE) ×4 IMPLANT
TAPE CLOTH 4X10 WHT NS (GAUZE/BANDAGES/DRESSINGS) IMPLANT
TRAY ENT MC OR (CUSTOM PROCEDURE TRAY) ×2 IMPLANT
TRAY FOLEY MTR SLVR 14FR STAT (SET/KITS/TRAYS/PACK) IMPLANT
TUBE ENDOTRAC EMG 7X10.2 (MISCELLANEOUS) ×2 IMPLANT
TUBE ENDOTRAC EMG 8X11.3 (MISCELLANEOUS) IMPLANT
TUBE ENDOTRACH  EMG 6MMTUBE EN (MISCELLANEOUS)
TUBE ENDOTRACH EMG 6MMTUBE EN (MISCELLANEOUS) IMPLANT

## 2021-06-04 NOTE — Op Note (Signed)
DATE OF PROCEDURE:  06/04/2021                              OPERATIVE REPORT  SURGEON:  Leta Baptist, MD  PREOPERATIVE DIAGNOSES: 1.  Papillary thyroid carcinoma   POSTOPERATIVE DIAGNOSES: 1.  Papillary thyroid carcinoma   PROCEDURE PERFORMED: Completion thyroidectomy   ANESTHESIA:  General endotracheal tube anesthesia.   COMPLICATIONS:  None.   ESTIMATED BLOOD LOSS: Minimal   INDICATION FOR PROCEDURE: Courtney Elliott is a 65 y.o. female who underwent right hemithyroidectomy last week to remove her suspicious right thyroid nodule.  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  Based on the above findings, the decision was made for the patient to undergo the above stated procedure. The risks, benefits, alternatives, and details of the procedure were discussed with the patient.  Questions were invited and answered.  Informed consent was obtained.   DESCRIPTION:  The patient was taken to the operating room and placed supine on the operating table.  General endotracheal tube anesthesia was administered by the anesthesiologist.  A nerve monitoring endotracheal tube was used.  The nerve monitoring system was functional throughout the case.   The patient was positioned and prepped and draped in the standard fashion for thyroid surgery.  1% lidocaine with 1 100,000 epinephrine was infiltrated at the planned site of incision.  A transverse lower neck incision was made by extending the original hemithyroidectomy incision.  The incision was carried down to the level of the platysma muscles.  Superiorly based and inferiorly based subplatysmal flaps were elevated in the standard fashion.  The strap muscles were divided at midline, and retracted laterally, exposing the thyroid gland.   Careful dissection was performed to free the left thyroid lobe from the surrounding soft tissue. The left recurrent laryngeal nerve was identified and preserved.  The nerve was  noted to be functional throughout the case.  An inferior left parathyroid gland was also identified and preserved.  The entire left thyroid lobe was resected and sent to the pathology department for permanent histologic identification.  A #10 JP drain was placed.  The strap muscles were reapproximated with 4-0 Vicryl sutures.  The incision was closed in layers with 4-0 Vicryl and Dermabond.   The care of the patient was turned over to the anesthesiologist.  The patient was awakened from anesthesia without difficulty.  The patient was extubated and transferred to the recovery room in good condition.   OPERATIVE FINDINGS: The remaining left thyroid lobe was removed without difficulty.  No significant nodule or mass was noted on the left side.   SPECIMEN: Completion thyroidectomy specimen   FOLLOWUP CARE:  The patient will be admitted for overnight observation.  Rolf Fells W Natane Heward 06/04/2021 11:55 AM

## 2021-06-04 NOTE — Transfer of Care (Signed)
Immediate Anesthesia Transfer of Care Note  Patient: Courtney Elliott  Procedure(s) Performed: COMPLETION OF THYROIDECTOMY (Neck)  Patient Location: PACU  Anesthesia Type:General  Level of Consciousness: drowsy and patient cooperative  Airway & Oxygen Therapy: Patient Spontanous Breathing and Patient connected to nasal cannula oxygen  Post-op Assessment: Report given to RN, Post -op Vital signs reviewed and stable and Patient moving all extremities  Post vital signs: Reviewed and stable  Last Vitals:  Vitals Value Taken Time  BP 153/101 06/04/21 1206  Temp    Pulse 58 06/04/21 1206  Resp 8 06/04/21 1206  SpO2 100 % 06/04/21 1206  Vitals shown include unvalidated device data.  Last Pain:  Vitals:   06/04/21 0821  PainSc: 0-No pain         Complications: No notable events documented.

## 2021-06-04 NOTE — Anesthesia Procedure Notes (Signed)
Procedure Name: Intubation Date/Time: 06/04/2021 10:45 AM Performed by: Moshe Salisbury, CRNA Pre-anesthesia Checklist: Patient identified, Emergency Drugs available, Suction available and Patient being monitored Patient Re-evaluated:Patient Re-evaluated prior to induction Oxygen Delivery Method: Circle System Utilized Preoxygenation: Pre-oxygenation with 100% oxygen Induction Type: IV induction Ventilation: Mask ventilation without difficulty Laryngoscope Size: Glidescope and 3 Tube type: NIMS ETT. Tube size: 7.0 mm Number of attempts: 1 Airway Equipment and Method: Rigid stylet and Video-laryngoscopy Placement Confirmation: ETT inserted through vocal cords under direct vision, positive ETCO2 and breath sounds checked- equal and bilateral Secured at: 22 cm Tube secured with: Tape Dental Injury: Teeth and Oropharynx as per pre-operative assessment

## 2021-06-04 NOTE — Anesthesia Postprocedure Evaluation (Signed)
Anesthesia Post Note  Patient: Courtney Elliott  Procedure(s) Performed: COMPLETION OF THYROIDECTOMY (Neck)     Patient location during evaluation: PACU Anesthesia Type: General Level of consciousness: awake and alert Pain management: pain level controlled Vital Signs Assessment: post-procedure vital signs reviewed and stable Respiratory status: spontaneous breathing, nonlabored ventilation, respiratory function stable and patient connected to nasal cannula oxygen Cardiovascular status: blood pressure returned to baseline and stable Postop Assessment: no apparent nausea or vomiting Anesthetic complications: no   No notable events documented.  Last Vitals:  Vitals:   06/04/21 1436 06/04/21 1512  BP: 138/79 (!) 158/88  Pulse: 61 63  Resp: 14 14  Temp: (!) 36.2 C 37 C  SpO2: 100% 100%    Last Pain:  Vitals:   06/04/21 1512  TempSrc: Axillary  PainSc:                  March Rummage Mikalia Fessel

## 2021-06-04 NOTE — Interval H&P Note (Signed)
History and Physical Interval Note:  06/04/2021 8:45 AM  Courtney Elliott  has presented today for surgery, with the diagnosis of THYROID CANCER.  The various methods of treatment have been discussed with the patient and family. After consideration of risks, benefits and other options for treatment, the patient has consented to  Procedure(s): COMPLETION THYROIDECTOMY (N/A) as a surgical intervention.  The patient's history has been reviewed, patient examined, no change in status, stable for surgery.  I have reviewed the patient's chart and labs.  Questions were answered to the patient's satisfaction.     Derika Eckles Cassie Freer

## 2021-06-05 DIAGNOSIS — C73 Malignant neoplasm of thyroid gland: Secondary | ICD-10-CM | POA: Diagnosis not present

## 2021-06-05 LAB — CALCIUM: Calcium: 9 mg/dL (ref 8.9–10.3)

## 2021-06-05 MED ORDER — AMOXICILLIN 875 MG PO TABS: 875.0000 mg | ORAL_TABLET | Freq: Two times a day (BID) | ORAL | 0 refills | Status: AC

## 2021-06-05 MED ORDER — OYSTER SHELL CALCIUM/D3 500-5 MG-MCG PO TABS: 1.0000 | ORAL_TABLET | Freq: Two times a day (BID) | ORAL | 0 refills | Status: AC

## 2021-06-05 MED ORDER — ACETAMINOPHEN 325 MG PO TABS: 650.0000 mg | ORAL_TABLET | ORAL | 0 refills | Status: AC | PRN

## 2021-06-05 NOTE — Discharge Instructions (Signed)
Thyroidectomy, Care After This sheet gives you information about how to care for yourself after your procedure. Your health care provider may also give you more specific instructions. If you have problems or questions, contact your health care provider. What can I expect after the procedure? After the procedure, it is common to have: Mild pain in the neck or upper body, especially when swallowing. A swollen neck. A sore throat. A weak or hoarse voice. Slight tingling or numbness around your mouth, or in your fingers or toes. This may last for a day or two after surgery. This condition is caused by low levels of calcium. You may be given calcium supplements to treat it. Follow these instructions at home: Medicines Take over-the-counter and prescription medicines only as told by your health care provider. Do not drive or use heavy machinery while taking prescription pain medicine. Do not take medicines that contain aspirin and ibuprofen until your health care provider says that you can. These medicines can increase your risk of bleeding. Take a thyroid hormone medicine as recommended by your health care provider. You will have to take this medicine for the rest of your life if your entire thyroid was removed. Eating and drinking Start slowly with eating. You may need to have only liquids and soft foods for a few days or as directed by your health care provider. To prevent or treat constipation while you are taking prescription pain medicine, your health care provider may recommend that you: Drink enough fluid to keep your urine pale yellow. Take over-the-counter or prescription medicines. Eat foods that are high in fiber, such as fresh fruits and vegetables, whole grains, and beans. Limit foods that are high in fat and processed sugars, such as fried and sweet foods. Incision care Follow instructions from your health care provider about how to take care of your incision. Make sure you: Leave  stitches (sutures), skin glue, or adhesive strips in place. These skin closures may need to stay in place for 2 weeks or longer. If adhesive strip edges start to loosen and curl up, you may trim the loose edges. Do not remove adhesive strips completely unless your health care provider tells you to do that. Check your incision area every day for signs of infection. Check for: Redness, swelling, or pain. Fluid or blood. Warmth. Pus or a bad smell. Activity For the first 10 days after the procedure or as instructed by your health care provider: Do not lift anything that is heavier than 10 lb (4.5 kg). Do not jog, swim, or do other strenuous exercises. Do not play contact sports. Avoid sitting for a long time without moving. Get up to take short walks every 1-2 hours. This is needed to improve blood flow and breathing. Ask for help if you feel weak or unsteady. Return to your normal activities as told by your health care provider. Ask your health care provider what activities are safe for you. General instructions Do not use any products that contain nicotine or tobacco, such as cigarettes and e-cigarettes. These can delay healing after surgery. If you need help quitting, ask your health care provider. Keep all follow-up visits as told by your health care provider. This is important. Your health care provider needs to monitor the calcium level in your blood to make sure that it does not become low. Contact a health care provider if you: Have a fever. Have more redness, swelling, or pain around your incision area. Have fluid or blood coming from your  incision area. Notice that your incision area feels warm to the touch. Have pus or a bad smell coming from your incision area. Have trouble talking. Have nausea or vomiting for more than 2 days. Get help right away if you: Have trouble breathing. Have trouble swallowing. Develop a rash. Develop a cough that gets worse. Notice that your speech  changes, or you have hoarseness that gets worse. Develop numbness, tingling, or muscle spasms in the arms, hands, feet, or face. Summary After the procedure, it is common to feel mild pain in the neck or upper body, especially when swallowing. Take medicines as told by your health care provider. These include pain medicines and thyroid hormones, if required. Follow instructions from your health care provider about how to take care of your incision. Watch for signs of infection. Keep all follow-up visits as told by your health care provider. This is important. Your health care provider needs to monitor the calcium level in your blood to make sure that it does not become low. Get help right away if you develop difficulty breathing, or numbness, tingling, or muscle spasms in the arms, hands, feet, or face. This information is not intended to replace advice given to you by your health care provider. Make sure you discuss any questions you have with your health care provider. Document Revised: 02/19/2020 Document Reviewed: 02/19/2020 Elsevier Patient Education  [place].

## 2021-06-05 NOTE — Progress Notes (Signed)
Pt discharged to home with husband via wheelchair with all belongings

## 2021-06-05 NOTE — Plan of Care (Signed)
  Problem: Clinical Measurements: Goal: Will remain free from infection Outcome: Progressing   Problem: Clinical Measurements: Goal: Respiratory complications will improve Outcome: Progressing   

## 2021-06-05 NOTE — Discharge Summary (Signed)
Physician Discharge Summary  Patient ID: TEKILA CAILLOUET MRN: [id number] DOB/AGE: 1955-10-06 65 y.o.  Admit date: 06/04/2021 Discharge date: 06/05/2021  Admission Diagnoses: Thyroid cancer  Discharge Diagnoses: Thyroid cancer Principal Problem:   S/P complete thyroidectomy   Discharged Condition: good  Hospital Course: Pt had an uneventful overnight stay. Pt tolerated po well. No bleeding. No stridor. Voice is strong.  Consults: None  Significant Diagnostic Studies: None  Treatments: surgery: Completion thyroidectomy  Discharge Exam: Blood pressure 126/75, pulse 68, temperature 98.3 F (36.8 C), temperature source Oral, resp. rate 16, height 5\' 5"  (1.651 m), weight 49.9 kg, SpO2 100 %. Incision/Wound:c/d/i Voice is strong  Disposition:  There are no questions and answers to display.         Allergies as of 06/05/2021   No Known Allergies      Medication List     TAKE these medications    acetaminophen 325 MG tablet Commonly known as: Tylenol Take 2 tablets (650 mg total) by mouth every 4 (four) hours as needed for up to 10 days for mild pain, moderate pain or fever.   amoxicillin 875 MG tablet Commonly known as: AMOXIL Take 1 tablet (875 mg total) by mouth 2 (two) times daily for 3 days.   calcium-vitamin D 500-5 MG-MCG tablet Commonly known as: OSCAL WITH D Take 1 tablet by mouth 2 (two) times daily.   levothyroxine 88 MCG tablet Commonly known as: SYNTHROID Take 1 tablet (88 mcg total) by mouth daily.   metoprolol succinate 25 MG 24 hr tablet Commonly known as: TOPROL-XL Take 1 tablet (25 mg total) by mouth daily. (NEEDS TO BE SEEN BEFORE NEXT REFILL)   multivitamin capsule Take 1 capsule by mouth daily.   VITAMIN D (ERGOCALCIFEROL) PO Take by mouth.        Follow-up Information     Leta Baptist, MD Follow up on 06/10/2021.   Specialty: Otolaryngology Why: at 12:20pm Contact information: [place]  [id number] [phone]                 Signed: Burley Saver 06/05/2021, 9:59 AM

## 2021-06-05 NOTE — Progress Notes (Signed)
  Transition of Care [facility]) Screening Note   Patient Details  Name: Courtney Elliott Date of Birth: 01-14-1956   Transition of Care [facility]) CM/SW Contact:    Bartholomew Crews, RN Phone Number: [phone] 06/05/2021, 10:45 AM    Transition of Care Department [facility]) has reviewed patient and no TOC needs have been identified at this time. We will continue to monitor patient advancement through interdisciplinary progression rounds. If new patient transition needs arise, please place a TOC consult.

## 2021-06-05 NOTE — Progress Notes (Signed)
Courtney Elliott is a 65 y.o. female patient admitted. Awake, alert - oriented  X 4 - no acute distress noted.  VSS - Blood pressure (!) 154/89, pulse 75, temperature 97.9 F (36.6 C), resp. rate 17, height 5\' 5"  (1.651 m), weight 49.9 kg, SpO2 100 %.    IV in place, occlusive dsg intact without redness.  Orientation to room, and floor completed.  Admission INP armband ID verified with patient/family, and in place.   SR up x 2, fall assessment complete, with patient and family able to verbalize understanding of risk associated with falls, and verbalized understanding to call nsg before up out of bed.  Call light within reach, patient able to voice, and demonstrate understanding. No evidence of skin break down JP Drain in place.   Will cont to eval and treat per MD orders.  Ahmere Hemenway, RN 06/05/2021 12:16 AM

## 2021-06-05 NOTE — Progress Notes (Signed)
Discharge instructions given to pt. Pt verbalized understanding of all teaching ?

## 2021-06-07 LAB — SURGICAL PATHOLOGY

## 2021-06-10 ENCOUNTER — Other Ambulatory Visit (INDEPENDENT_AMBULATORY_CARE_PROVIDER_SITE_OTHER): Payer: Self-pay | Admitting: Otolaryngology

## 2021-06-13 ENCOUNTER — Other Ambulatory Visit (HOSPITAL_COMMUNITY): Payer: Self-pay | Admitting: Endocrinology

## 2021-06-13 DIAGNOSIS — C73 Malignant neoplasm of thyroid gland: Secondary | ICD-10-CM

## 2021-06-13 NOTE — Written Directive (Addendum)
MOLECULAR IMAGING AND THERAPEUTICS WRITTEN DIRECTIVE   PATIENT NAME: ADALENA ABDULLA  PT DOB:   Jan 10, 1956                                              MRN: [id number]  ---------------------------------------------------------------------------------------------------------------------   I-131 THYROID CANCER THERAPY   RADIOPHARMACEUTICAL:  Iodine-131 Capsule    PRESCRIBED DOSE FOR ADMINISTRATION: 50 mCi   ROUTE OFADMINISTRATION:  PO   DIAGNOSIS: Papillary Thyroid Cancer   REFERRING PHYSICIAN: Balan   THYROGEN STIMULATION OR HORMONE WITHDRAW: Thyrogen Stimulation   REMNANT ABLATION OR ADJUVANT THERAPY: Remnant   DATE OF THYROIDECTOMY: 05/27/21- Rt Thyroid Lobe , 06/04/21 Total Thyroidectomy Completion   SURGEON: Dr. Leta Baptist   TSH:   Lab Results  Component Value Date   TSH 0.986 09/24/2020   TSH 2.730 03/28/2019   TSH 1.780 07/20/2017     PRIOR I-131 THERAPY (Date and Dose):None   Pathology:  Cell type: [x]   Papillary  []   Follicular  []   [place]   Largest tumor focus:     1.2 cm  Extrathyroidal Extension?     Yes []   No [x]     Lymphovascular Invasion?  Yes  [x]   No  []     Margins positive ? Yes []   No [x]     Lymph nodes positive? Yes []   No  []       # positive nodes:  0 # negative nodes:  0   TNM staging: pT:   1b      PN:  x       Mx:    ADDITIONAL PHYSICIAN COMMENTS/NOTES: 65 year female with Low risk disease.   Remnant ablation.   AUTHORIZED USER SIGNATURE & TIME STAMP:John Melba Coon, MD   06/14/21    1:51 PM

## 2021-06-14 ENCOUNTER — Telehealth: Payer: Self-pay | Admitting: Family Medicine

## 2021-06-14 DIAGNOSIS — I10 Essential (primary) hypertension: Secondary | ICD-10-CM

## 2021-06-14 MED ORDER — METOPROLOL SUCCINATE ER 25 MG PO TB24: 25.0000 mg | ORAL_TABLET | Freq: Every day | ORAL | 1 refills | Status: DC

## 2021-06-14 MED ORDER — LEVOTHYROXINE SODIUM 88 MCG PO TABS: 88.0000 ug | ORAL_TABLET | Freq: Every day | ORAL | 1 refills | Status: DC

## 2021-06-14 NOTE — Telephone Encounter (Signed)
°  Prescription Request  06/14/2021   What is the name of the medication or equipment? ALL  Have you contacted your pharmacy to request a refill? YES  Which pharmacy would you like this sent to? [facility]  Pt made appt to see Dr Lajuana Ripple on 07/26/21 for med refill because it was first available day for appt slots. Pt needs meds called in to last her until her appt.

## 2021-06-14 NOTE — Telephone Encounter (Signed)
Patient aware.

## 2021-06-15 ENCOUNTER — Ambulatory Visit: Payer: BC Managed Care – PPO | Admitting: Family Medicine

## 2021-06-18 LAB — CALCIUM: Calcium: 9.3 mg/dL (ref 8.7–10.3)

## 2021-06-18 LAB — TSH: TSH: 0.628 u[IU]/mL (ref 0.450–4.500)

## 2021-06-18 LAB — T4+FREE T4
Free T4 by Dialysis: 2 ng/dL — ABNORMAL HIGH
Thyroxine (T4): 9.9 ug/dL

## 2021-07-04 ENCOUNTER — Other Ambulatory Visit: Payer: Self-pay

## 2021-07-04 ENCOUNTER — Encounter (HOSPITAL_COMMUNITY)
Admission: RE | Admit: 2021-07-04 | Discharge: 2021-07-04 | Disposition: A | Discharge status: Home / Self Care | Payer: BC Managed Care – PPO | Source: Ambulatory Visit | Attending: Endocrinology | Admitting: Endocrinology

## 2021-07-04 DIAGNOSIS — C73 Malignant neoplasm of thyroid gland: Secondary | ICD-10-CM | POA: Diagnosis present

## 2021-07-04 MED ORDER — THYROTROPIN ALFA 0.9 MG IM SOLR: 0.9000 mg | INTRAMUSCULAR | Status: AC

## 2021-07-04 MED ORDER — THYROTROPIN ALFA 0.9 MG IM SOLR
INTRAMUSCULAR | Status: AC
  Administered 2021-07-04: 0.9 mg via INTRAMUSCULAR
  Filled 2021-07-04: qty 0.9

## 2021-07-05 ENCOUNTER — Encounter (HOSPITAL_COMMUNITY)
Admission: RE | Admit: 2021-07-05 | Discharge: 2021-07-05 | Disposition: A | Discharge status: Home / Self Care | Payer: BC Managed Care – PPO | Source: Ambulatory Visit | Attending: Endocrinology | Admitting: Endocrinology

## 2021-07-05 DIAGNOSIS — C73 Malignant neoplasm of thyroid gland: Secondary | ICD-10-CM | POA: Diagnosis not present

## 2021-07-05 MED ORDER — THYROTROPIN ALFA 0.9 MG IM SOLR
INTRAMUSCULAR | Status: AC
  Administered 2021-07-05: 0.9 mg via INTRAMUSCULAR
  Filled 2021-07-05: qty 0.9

## 2021-07-05 MED ORDER — THYROTROPIN ALFA 0.9 MG IM SOLR: 0.9000 mg | INTRAMUSCULAR | Status: AC

## 2021-07-06 ENCOUNTER — Encounter (HOSPITAL_COMMUNITY)
Admission: RE | Admit: 2021-07-06 | Discharge: 2021-07-06 | Disposition: A | Discharge status: Home / Self Care | Payer: BC Managed Care – PPO | Source: Ambulatory Visit | Attending: Endocrinology | Admitting: Endocrinology

## 2021-07-06 ENCOUNTER — Other Ambulatory Visit: Payer: Self-pay

## 2021-07-06 MED ORDER — SODIUM IODIDE I 131 CAPSULE
49.5000 | Freq: Once | INTRAVENOUS | Status: AC | PRN
  Administered 2021-07-06: 49.5 via ORAL

## 2021-07-08 ENCOUNTER — Other Ambulatory Visit: Payer: Self-pay | Admitting: Family Medicine

## 2021-07-08 DIAGNOSIS — I10 Essential (primary) hypertension: Secondary | ICD-10-CM

## 2021-07-13 ENCOUNTER — Other Ambulatory Visit: Payer: Self-pay

## 2021-07-13 ENCOUNTER — Ambulatory Visit (HOSPITAL_COMMUNITY)
Admission: RE | Admit: 2021-07-13 | Discharge: 2021-07-13 | Disposition: A | Discharge status: Home / Self Care | Payer: BC Managed Care – PPO | Source: Ambulatory Visit | Attending: Endocrinology | Admitting: Endocrinology

## 2021-07-13 DIAGNOSIS — C73 Malignant neoplasm of thyroid gland: Secondary | ICD-10-CM | POA: Diagnosis not present

## 2021-07-26 ENCOUNTER — Ambulatory Visit: Payer: BC Managed Care – PPO | Admitting: Family Medicine

## 2021-07-26 ENCOUNTER — Encounter: Payer: Self-pay | Admitting: Family Medicine

## 2021-07-26 VITALS — BP 142/91 | HR 66 | Temp 97.9°F | Ht 65.0 in | Wt 107.0 lb

## 2021-07-26 DIAGNOSIS — E039 Hypothyroidism, unspecified: Secondary | ICD-10-CM | POA: Diagnosis not present

## 2021-07-26 DIAGNOSIS — I10 Essential (primary) hypertension: Secondary | ICD-10-CM | POA: Diagnosis not present

## 2021-07-26 DIAGNOSIS — K429 Umbilical hernia without obstruction or gangrene: Secondary | ICD-10-CM | POA: Diagnosis not present

## 2021-07-26 MED ORDER — METOPROLOL SUCCINATE ER 25 MG PO TB24: 25.0000 mg | ORAL_TABLET | Freq: Every day | ORAL | 4 refills | Status: DC

## 2021-07-26 NOTE — Progress Notes (Signed)
Subjective: CC: Follow-up hypertension, hypothyroidism, hernia PCP: Janora Norlander, DO [id number] R Courtney Elliott is a 66 y.o. female presenting to clinic today for:  1.  Hypertension Patient is compliant with Toprol 25 mg extended release daily.  She is very physically active and maintains a very balanced diet.  She denies any chest pain, shortness of breath, change in exercise tolerance, headache or visual disturbance.  Her blood pressure has been well controlled at other offices that she is not quite sure why it is elevated today.  2.  Hypothyroidism Patient is status post thyroidectomy.  She continues to follow-up with endocrinology, Dr. Chalmers Cater, regularly for thyroid checkups.  She recently had labs done there  3.  Hernia Patient reports that the umbilical hernia that was noted previously only to be pea-sized, has gotten bigger and is starting to hurt now.  She denies any nausea, vomiting, bleeding in the stool or change in bowel movements.  Asking for surgical referral   ROS: Per HPI  No Known Allergies Past Medical History:  Diagnosis Date   Blood transfusion without reported diagnosis 1969   Heart murmur    Hypertension    Hypothyroidism    Kidney disease    Pt has 1 kidney   Mitral valve prolapse    Thyroid disease    hypothyroid    Current Outpatient Medications:    calcium-vitamin D (OSCAL WITH D) 500-5 MG-MCG tablet, Take 1 tablet by mouth 2 (two) times daily., Disp: 60 tablet, Rfl: 0   levothyroxine (SYNTHROID) 88 MCG tablet, Take 1 tablet (88 mcg total) by mouth daily., Disp: 30 tablet, Rfl: 1   Multiple Vitamin (MULTIVITAMIN) capsule, Take 1 capsule by mouth daily., Disp: , Rfl:    VITAMIN D, ERGOCALCIFEROL, PO, Take by mouth., Disp: , Rfl:    metoprolol succinate (TOPROL-XL) 25 MG 24 hr tablet, Take 1 tablet (25 mg total) by mouth daily., Disp: 90 tablet, Rfl: 4 Social History   Socioeconomic History   Marital status: Married    Spouse name: Not on file    Number of children: Not on file   Years of education: Not on file   Highest education level: Not on file  Occupational History   Not on file  Tobacco Use   Smoking status: Never   Smokeless tobacco: Never  Vaping Use   Vaping Use: Never used  Substance and Sexual Activity   Alcohol use: Yes    Comment: occasional    Drug use: No   Sexual activity: Not on file    Comment: husband vasectomy   Other Topics Concern   Not on file  Social History Narrative   Not on file   Social Determinants of Health   Financial Resource Strain: Not on file  Food Insecurity: Not on file  Transportation Needs: Not on file  Physical Activity: Not on file  Stress: Not on file  Social Connections: Not on file  Intimate Partner Violence: Not on file   Family History  Problem Relation Age of Onset   Diabetes Mother    Heart disease Mother    Alcohol abuse Father    Diabetes Sister    Heart disease Brother 52   Hypertension Brother    Alzheimer's disease Maternal Aunt    Colon cancer Neg Hx    Colon polyps Neg Hx    Esophageal cancer Neg Hx    Stomach cancer Neg Hx    Rectal cancer Neg Hx     Objective: Office vital  signs reviewed. BP (!) 142/91    Pulse 66    Temp 97.9 F (36.6 C)    Ht 5\' 5"  (1.651 m)    Wt 107 lb (48.5 kg)    SpO2 100%    BMI 17.81 kg/m   Physical Examination:  General: Awake, alert, well-appearing, physically fit female, No acute distress HEENT: Postsurgical scarring along the neck with slight puckering of the scar appreciated Cardio: regular rate and rhythm, S1S2 heard, no murmurs appreciated Pulm: clear to auscultation bilaterally, no wheezes, rhonchi or rales; normal work of breathing on room air GI: soft, non-tender, non-distended, bowel sounds present x4, no hepatomegaly, no splenomegaly, she has a chickpea sized soft tissue masses appreciated in standing position but totally reduces in supine position.  No palpable defect appreciated within the abdominal  wall  Assessment/ Plan: 66 y.o. female   Essential hypertension - Plan: metoprolol succinate (TOPROL-XL) 25 MG 24 hr tablet  Acquired hypothyroidism  Periumbilical hernia - Plan: Ambulatory referral to General Surgery  Blood pressure is not at goal.  Of asked that she come back in for blood pressure recheck.  Continue to follow-up with endocrinology for thyroid management.  Release of information form completed today to get the most recent labs and evaluation  Suspect periumbilical hernia.  Referral to general surgery placed.  We discussed red flag signs and symptoms warranting evaluation in the ER.  She voiced good understanding  Orders Placed This Encounter  Procedures   Ambulatory referral to General Surgery    Referral Priority:   Routine    Referral Type:   Surgical    Referral Reason:   Specialty Services Required    Requested Specialty:   General Surgery    Number of Visits Requested:   1   Meds ordered this encounter  Medications   metoprolol succinate (TOPROL-XL) 25 MG 24 hr tablet    Sig: Take 1 tablet (25 mg total) by mouth daily.    Dispense:  90 tablet    Refill:  [place], [place] [phone]

## 2021-08-01 ENCOUNTER — Other Ambulatory Visit: Payer: Self-pay | Admitting: Nurse Practitioner

## 2021-08-01 NOTE — Telephone Encounter (Signed)
Yes, please send refill request to Dr Chalmers Cater

## 2021-08-19 ENCOUNTER — Other Ambulatory Visit: Payer: Self-pay | Admitting: Family Medicine

## 2021-08-29 ENCOUNTER — Ambulatory Visit (INDEPENDENT_AMBULATORY_CARE_PROVIDER_SITE_OTHER): Payer: BC Managed Care – PPO | Admitting: *Deleted

## 2021-08-29 VITALS — BP 152/87 | HR 68

## 2021-08-29 DIAGNOSIS — I10 Essential (primary) hypertension: Secondary | ICD-10-CM

## 2021-08-30 NOTE — Progress Notes (Signed)
BP remains elevated despite her leading an extremely healthy lifestyle and consistent use of her beta-blocker.  I would favor adding low-dose Norvasc to her regimen for blood pressure control.  If she is agreeable, please send in Norvasc 2.5 mg #90 and have her follow-up in about 2 to 4 weeks for blood pressure recheck ?

## 2021-09-01 MED ORDER — AMLODIPINE BESYLATE 2.5 MG PO TABS: 2.5000 mg | ORAL_TABLET | Freq: Every day | ORAL | 0 refills | Status: DC

## 2021-09-01 NOTE — Addendum Note (Signed)
Addended by: Thana Ates on: 09/01/2021 03:24 PM ? ? Modules accepted: Orders ? ?

## 2021-09-01 NOTE — Progress Notes (Signed)
Patient is agreeable to start amlodipine. Rx sent to pharmacy. She is aware to schedule a follow up appt in 2-4 weeks.  ?

## 2021-09-05 ENCOUNTER — Encounter (INDEPENDENT_AMBULATORY_CARE_PROVIDER_SITE_OTHER): Payer: BC Managed Care – PPO | Admitting: Ophthalmology

## 2021-09-05 DIAGNOSIS — H43813 Vitreous degeneration, bilateral: Secondary | ICD-10-CM

## 2021-09-05 DIAGNOSIS — H35033 Hypertensive retinopathy, bilateral: Secondary | ICD-10-CM

## 2021-09-05 DIAGNOSIS — H35342 Macular cyst, hole, or pseudohole, left eye: Secondary | ICD-10-CM

## 2021-09-05 DIAGNOSIS — I10 Essential (primary) hypertension: Secondary | ICD-10-CM

## 2021-09-20 ENCOUNTER — Other Ambulatory Visit: Payer: Self-pay | Admitting: Family Medicine

## 2021-09-21 ENCOUNTER — Other Ambulatory Visit: Payer: Self-pay | Admitting: Family Medicine

## 2021-09-21 MED ORDER — LEVOTHYROXINE SODIUM 88 MCG PO TABS: 88.0000 ug | ORAL_TABLET | Freq: Every day | ORAL | 3 refills | Status: AC

## 2021-11-22 ENCOUNTER — Other Ambulatory Visit: Payer: Self-pay | Admitting: Family Medicine

## 2021-12-06 ENCOUNTER — Encounter (INDEPENDENT_AMBULATORY_CARE_PROVIDER_SITE_OTHER): Payer: BC Managed Care – PPO | Admitting: Ophthalmology

## 2022-02-18 ENCOUNTER — Other Ambulatory Visit: Payer: Self-pay | Admitting: Family Medicine

## 2022-03-20 ENCOUNTER — Other Ambulatory Visit: Payer: Self-pay | Admitting: Family Medicine

## 2022-06-06 ENCOUNTER — Other Ambulatory Visit (HOSPITAL_COMMUNITY): Payer: Self-pay | Admitting: Endocrinology

## 2022-06-06 DIAGNOSIS — C73 Malignant neoplasm of thyroid gland: Secondary | ICD-10-CM

## 2022-06-06 NOTE — Written Directive (Addendum)
MOLECULAR IMAGING AND THERAPEUTICS WRITTEN DIRECTIVE   PATIENT NAME: Courtney Elliott  PT DOB:   1956-04-20                                              MRN: [id number]  ---------------------------------------------------------------------------------------------------------------------  I-131 WHOLE BODY SCAN    RADIOPHARMACEUTICAL: Iodine-131 Capsule for Diagnostic Imaging   PRESCRIBED DOSE FOR ADMINISTRATION: 4 mCi   ROUTE OFADMINISTRATION: PO   DIAGNOSIS: Papillary Carcinoma of Thyroid   REFERRING PHYSICIAN: Dr. Arville Care STIMULATION OR HORMONE WITHDRAW: Thyrogen Stimulation   DATE OF THYROIDECTOMY: 05/27/2021   SURGEON: Dr. Leta Baptist   TSH:  0.290 as of 01/26/2022 Lab Results  Component Value Date   TSH 0.628 06/10/2021   TSH 0.986 09/24/2020   TSH 2.730 03/28/2019     PRIOR I-131 THERAPY (Date and Dose):   ADDITIONAL PHYSICIAN COMMENTS/NOTES   AUTHORIZED USER SIGNATURE & TIME STAMP: Rennis Golden, MD   06/06/22    4:40 PM

## 2022-07-26 ENCOUNTER — Encounter: Payer: Self-pay | Admitting: Family Medicine

## 2022-08-21 ENCOUNTER — Encounter (HOSPITAL_COMMUNITY)
Admission: RE | Admit: 2022-08-21 | Discharge: 2022-08-21 | Disposition: A | Discharge status: Home / Self Care | Payer: BC Managed Care – PPO | Source: Ambulatory Visit | Attending: Endocrinology | Admitting: Endocrinology

## 2022-08-21 DIAGNOSIS — C73 Malignant neoplasm of thyroid gland: Secondary | ICD-10-CM | POA: Insufficient documentation

## 2022-08-21 MED ORDER — THYROTROPIN ALFA 0.9 MG IM SOLR
INTRAMUSCULAR | Status: AC
  Administered 2022-08-21: 0.9 mg via INTRAMUSCULAR
  Filled 2022-08-21: qty 0.9

## 2022-08-21 MED ORDER — THYROTROPIN ALFA 0.9 MG IM SOLR
0.9000 mg | INTRAMUSCULAR | Status: AC
Start: 2022-08-21 — End: 2022-08-21

## 2022-08-22 ENCOUNTER — Encounter (HOSPITAL_COMMUNITY)
Admission: RE | Admit: 2022-08-22 | Discharge: 2022-08-22 | Disposition: A | Discharge status: Home / Self Care | Payer: BC Managed Care – PPO | Source: Ambulatory Visit | Attending: Endocrinology | Admitting: Endocrinology

## 2022-08-22 DIAGNOSIS — C73 Malignant neoplasm of thyroid gland: Secondary | ICD-10-CM | POA: Diagnosis not present

## 2022-08-22 MED ORDER — THYROTROPIN ALFA 0.9 MG IM SOLR
0.9000 mg | INTRAMUSCULAR | Status: AC
  Administered 2022-08-22: 0.9 mg via INTRAMUSCULAR

## 2022-08-22 MED ORDER — THYROTROPIN ALFA 0.9 MG IM SOLR
INTRAMUSCULAR | Status: AC
  Filled 2022-08-22: qty 0.9

## 2022-08-23 ENCOUNTER — Encounter (HOSPITAL_COMMUNITY)
Admission: RE | Admit: 2022-08-23 | Discharge: 2022-08-23 | Disposition: A | Discharge status: Home / Self Care | Payer: BC Managed Care – PPO | Source: Ambulatory Visit | Attending: Endocrinology | Admitting: Endocrinology

## 2022-08-23 MED ORDER — SODIUM IODIDE I 131 CAPSULE
3.9000 | Freq: Once | INTRAVENOUS | Status: AC
  Administered 2022-08-23: 3.9 via ORAL

## 2022-08-25 ENCOUNTER — Encounter (HOSPITAL_COMMUNITY)
Admission: RE | Admit: 2022-08-25 | Discharge: 2022-08-25 | Disposition: A | Discharge status: Home / Self Care | Payer: BC Managed Care – PPO | Source: Ambulatory Visit | Attending: Endocrinology | Admitting: Endocrinology

## 2022-09-12 ENCOUNTER — Other Ambulatory Visit: Payer: Self-pay | Admitting: Family Medicine

## 2022-09-12 DIAGNOSIS — I10 Essential (primary) hypertension: Secondary | ICD-10-CM

## 2022-09-12 NOTE — Telephone Encounter (Signed)
Left message for patient to call back to schedule appt with PCP for med refill.

## 2022-10-10 ENCOUNTER — Encounter: Payer: Self-pay | Admitting: Family Medicine

## 2022-10-24 ENCOUNTER — Other Ambulatory Visit: Payer: Self-pay | Admitting: Family Medicine

## 2022-10-24 DIAGNOSIS — I10 Essential (primary) hypertension: Secondary | ICD-10-CM

## 2022-10-24 NOTE — Telephone Encounter (Signed)
[facility] NTBS Last Ov 07/26/21

## 2022-10-25 MED ORDER — METOPROLOL SUCCINATE ER 25 MG PO TB24: 25.0000 mg | ORAL_TABLET | Freq: Every day | ORAL | 0 refills | Status: DC

## 2022-10-25 NOTE — Telephone Encounter (Signed)
Pt scheduled appt for June 19

## 2022-10-25 NOTE — Addendum Note (Signed)
Addended by: Julious Payer D on: 10/25/2022 01:07 PM   Modules accepted: Orders

## 2022-12-13 ENCOUNTER — Ambulatory Visit: Payer: BC Managed Care – PPO | Admitting: Family Medicine

## 2023-01-02 ENCOUNTER — Telehealth: Payer: Self-pay | Admitting: Family Medicine

## 2023-01-02 ENCOUNTER — Other Ambulatory Visit: Payer: Self-pay | Admitting: Family Medicine

## 2023-01-02 DIAGNOSIS — I10 Essential (primary) hypertension: Secondary | ICD-10-CM

## 2023-01-02 MED ORDER — METOPROLOL SUCCINATE ER 25 MG PO TB24: 25.0000 mg | ORAL_TABLET | Freq: Every day | ORAL | 0 refills | Status: DC

## 2023-01-02 NOTE — Telephone Encounter (Signed)
She cancelled her June appt. She MUST keep ov to get ongoing refills.

## 2023-01-02 NOTE — Telephone Encounter (Signed)
  Prescription Request  01/02/2023  Is this a "Controlled Substance" medicine? no  Have you seen your PCP in the last 2 weeks? No pt has appt on 02/20/23 will run out of meds before then  If YES, route message to pool  -  If NO, patient needs to be scheduled for appointment.  What is the name of the medication or equipment? metoprolol succinate (TOPROL-XL) 25 MG 24 hr tablet   Have you contacted your pharmacy to request a refill? yes   Which pharmacy would you like this sent to? [facility]   Patient notified that their request is being sent to the clinical staff for review and that they should receive a response within 2 business days.

## 2023-02-20 ENCOUNTER — Ambulatory Visit: Payer: BC Managed Care – PPO | Admitting: Family Medicine

## 2023-03-30 ENCOUNTER — Ambulatory Visit: Payer: BC Managed Care – PPO | Admitting: Family Medicine

## 2023-04-07 ENCOUNTER — Other Ambulatory Visit: Payer: Self-pay | Admitting: Family Medicine

## 2023-04-07 DIAGNOSIS — I10 Essential (primary) hypertension: Secondary | ICD-10-CM

## 2023-04-20 ENCOUNTER — Encounter: Payer: Self-pay | Admitting: Family Medicine

## 2023-05-01 ENCOUNTER — Encounter: Payer: Self-pay | Admitting: Family Medicine

## 2023-05-06 ENCOUNTER — Other Ambulatory Visit: Payer: Self-pay | Admitting: Family Medicine

## 2023-05-06 DIAGNOSIS — I10 Essential (primary) hypertension: Secondary | ICD-10-CM

## 2023-05-29 ENCOUNTER — Ambulatory Visit: Payer: BC Managed Care – PPO
